# Patient Record
Sex: Female | Born: 1975
Health system: Southern US, Community
[De-identification: ages and names within clinical notes are randomized; demographics above are authoritative.]

## PROBLEM LIST (undated history)

## (undated) DIAGNOSIS — T7840XA Allergy, unspecified, initial encounter: Secondary | ICD-10-CM

## (undated) HISTORY — DX: Allergy, unspecified, initial encounter: T78.40XA

---

## 2013-01-22 LAB — HM PAP SMEAR

## 2015-10-01 DIAGNOSIS — Z1231 Encounter for screening mammogram for malignant neoplasm of breast: Secondary | ICD-10-CM | POA: Diagnosis not present

## 2015-10-23 LAB — HM MAMMOGRAPHY: HM Mammogram: NORMAL (ref 0–4)

## 2015-11-01 ENCOUNTER — Ambulatory Visit (INDEPENDENT_AMBULATORY_CARE_PROVIDER_SITE_OTHER): Payer: Self-pay | Admitting: Family Medicine

## 2015-11-01 ENCOUNTER — Encounter: Payer: Self-pay | Admitting: Family Medicine

## 2015-11-01 VITALS — BP 114/74 | Temp 98.2°F | Wt 200.0 lb

## 2015-11-01 DIAGNOSIS — J309 Allergic rhinitis, unspecified: Secondary | ICD-10-CM

## 2015-11-01 DIAGNOSIS — J0120 Acute ethmoidal sinusitis, unspecified: Secondary | ICD-10-CM

## 2015-11-01 MED ORDER — MONTELUKAST SODIUM 10 MG PO TABS: 10.0000 mg | ORAL_TABLET | Freq: Every day | ORAL | 3 refills | Status: DC

## 2015-11-01 MED ORDER — AMOXICILLIN 500 MG PO CAPS: 500.0000 mg | ORAL_CAPSULE | Freq: Three times a day (TID) | ORAL | 0 refills | Status: AC

## 2015-11-01 NOTE — Patient Instructions (Addendum)
Allergic Rhinitis Allergic rhinitis is when the mucous membranes in the nose respond to allergens. Allergens are particles in the air that cause your body to have an allergic reaction. This causes you to release allergic antibodies. Through a chain of events, these eventually cause you to release histamine into the blood stream. Although meant to protect the body, it is this release of histamine that causes your discomfort, such as frequent sneezing, congestion, and an itchy, runny nose.  CAUSES Seasonal allergic rhinitis (hay fever) is caused by pollen allergens that may come from grasses, trees, and weeds. Year-round allergic rhinitis (perennial allergic rhinitis) is caused by allergens such as house dust mites, pet dander, and mold spores. SYMPTOMS  Nasal stuffiness (congestion).  Itchy, runny nose with sneezing and tearing of the eyes. DIAGNOSIS Your health care provider can help you determine the allergen or allergens that trigger your symptoms. If you and your health care provider are unable to determine the allergen, skin or blood testing may be used. Your health care provider will diagnose your condition after taking your health history and performing a physical exam. Your health care provider may assess you for other related conditions, such as asthma, pink eye, or an ear infection. TREATMENT Allergic rhinitis does not have a cure, but it can be controlled by:  Medicines that block allergy symptoms. These may include allergy shots, nasal sprays, and oral antihistamines.  Avoiding the allergen. Hay fever may often be treated with antihistamines in pill or nasal spray forms. Antihistamines block the effects of histamine. There are over-the-counter medicines that may help with nasal congestion and swelling around the eyes. Check with your health care provider before taking or giving this medicine. If avoiding the allergen or the medicine prescribed do not work, there are many new medicines  your health care provider can prescribe. Stronger medicine may be used if initial measures are ineffective. Desensitizing injections can be used if medicine and avoidance does not work. Desensitization is when a patient is given ongoing shots until the body becomes less sensitive to the allergen. Make sure you follow up with your health care provider if problems continue. HOME CARE INSTRUCTIONS It is not possible to completely avoid allergens, but you can reduce your symptoms by taking steps to limit your exposure to them. It helps to know exactly what you are allergic to so that you can avoid your specific triggers. SEEK MEDICAL CARE IF:  You have a fever.  You develop a cough that does not stop easily (persistent).  You have shortness of breath.  You start wheezing.  Symptoms interfere with normal daily activities.   This information is not intended to replace advice given to you by your health care provider. Make sure you discuss any questions you have with your health care provider.   Document Released: 12/10/2000 Document Revised: 04/07/2014 Document Reviewed: 11/22/2012 Elsevier Interactive Patient Education 2016 Elsevier Inc.  Sinusitis, Adult Sinusitis is redness, soreness, and inflammation of the paranasal sinuses. Paranasal sinuses are air pockets within the bones of your face. They are located beneath your eyes, in the middle of your forehead, and above your eyes. In healthy paranasal sinuses, mucus is able to drain out, and air is able to circulate through them by way of your nose. However, when your paranasal sinuses are inflamed, mucus and air can become trapped. This can allow bacteria and other germs to grow and cause infection. Sinusitis can develop quickly and last only a short time (acute) or continue over a  long period (chronic). Sinusitis that lasts for more than 12 weeks is considered chronic. CAUSES Causes of sinusitis include:  Allergies.  Structural  abnormalities, such as displacement of the cartilage that separates your nostrils (deviated septum), which can decrease the air flow through your nose and sinuses and affect sinus drainage.  Functional abnormalities, such as when the small hairs (cilia) that line your sinuses and help remove mucus do not work properly or are not present. SIGNS AND SYMPTOMS Symptoms of acute and chronic sinusitis are the same. The primary symptoms are pain and pressure around the affected sinuses. Other symptoms include:  Upper toothache.  Earache.  Headache.  Bad breath.  Decreased sense of smell and taste.  A cough, which worsens when you are lying flat.  Fatigue.  Fever.  Thick drainage from your nose, which often is green and may contain pus (purulent).  Swelling and warmth over the affected sinuses. DIAGNOSIS Your health care provider will perform a physical exam. During your exam, your health care provider may perform any of the following to help determine if you have acute sinusitis or chronic sinusitis:  Look in your nose for signs of abnormal growths in your nostrils (nasal polyps).  Tap over the affected sinus to check for signs of infection.  View the inside of your sinuses using an imaging device that has a light attached (endoscope). If your health care provider suspects that you have chronic sinusitis, one or more of the following tests may be recommended:  Allergy tests.  Nasal culture. A sample of mucus is taken from your nose, sent to a lab, and screened for bacteria.  Nasal cytology. A sample of mucus is taken from your nose and examined by your health care provider to determine if your sinusitis is related to an allergy. TREATMENT Most cases of acute sinusitis are related to a viral infection and will resolve on their own within 10 days. Sometimes, medicines are prescribed to help relieve symptoms of both acute and chronic sinusitis. These may include pain medicines,  decongestants, nasal steroid sprays, or saline sprays. However, for sinusitis related to a bacterial infection, your health care provider will prescribe antibiotic medicines. These are medicines that will help kill the bacteria causing the infection. Rarely, sinusitis is caused by a fungal infection. In these cases, your health care provider will prescribe antifungal medicine. For some cases of chronic sinusitis, surgery is needed. Generally, these are cases in which sinusitis recurs more than 3 times per year, despite other treatments. HOME CARE INSTRUCTIONS  Drink plenty of water. Water helps thin the mucus so your sinuses can drain more easily.  Use a humidifier.  Inhale steam 3-4 times a day (for example, sit in the bathroom with the shower running).  Apply a warm, moist washcloth to your face 3-4 times a day, or as directed by your health care provider.  Use saline nasal sprays to help moisten and clean your sinuses.  Take medicines only as directed by your health care provider.  If you were prescribed either an antibiotic or antifungal medicine, finish it all even if you start to feel better.   SEEK IMMEDIATE MEDICAL CARE IF:  You have increasing pain or severe headaches.  You have nausea, vomiting, or drowsiness.  You have swelling around your face.  You have vision problems.  You have a stiff neck.  You have difficulty breathing.   This information is not intended to replace advice given to you by your health care provider. Make  sure you discuss any questions you have with your health care provider.   Document Released: 03/17/2005 Document Revised: 04/07/2014 Document Reviewed: 04/01/2011 Elsevier Interactive Patient Education 2016 Elsevier Inc. Cont taking maintenance allergy medication/Zyrtec.

## 2015-11-01 NOTE — Progress Notes (Signed)
   Subjective:    Patient ID: Tina Bautista, female    DOB: [birth year], 40 y.o.   MRN: [id number]  HPI    Review of Systems     Objective:   Physical Exam        Assessment & Plan:

## 2015-11-01 NOTE — Progress Notes (Signed)
   Subjective:    Patient ID: VERANIA ROYTMAN, female    DOB: 05/28/1975, 40 y.o.   MRN: [id number]  HPI  40 y.o. Presents with bil ear fullness x 2 days rt > lt. Pt states she has been leaning head to rt. Pt cleaning and moving into a new house. Possible exposure to cat dander from previous home owners.  Pt has not been taking maintenance environmental allergy meds. Pt took Benadryl last night.     Review of Systems Constitutional; Neg denies headache/fatigue Eyes watery/red/ Nose nasal stuffiness Ears fullness rt>lt CV Neg Resp denies cough Skin Neg Allergy sneezing/no runny nose/no angioedema    Objective:   Physical Exam  VSS Eyes erythematous/watery/no purulent drainage/no vision chges ENT postnasal drainage/throat erythematous/swellling and warmth over affected sinuses/maxillary sinus tender to touch/no nasal drainage/mild swelling in turbinates bil/ decrease sense of smell  Ears WNL internal/external/no hearing loss Neck bil cervical lymphadenopathy CV HR reg Resp BBS audible and clear  Skin inspect WNL         Assessment & Plan:  Pt ed given. Pt to take maintenance allergy meds Zyrtec/optional anithistamine eye drops/Flonase/ Anitibiotic given to be taken if symptoms do not improve in several days Avoid the allergens

## 2016-01-04 MED FILL — MONTELUKAST SOD 10 MG TAB: 10 | 30 days supply | Qty: 30 | Fill #0

## 2016-01-07 DIAGNOSIS — M9903 Segmental and somatic dysfunction of lumbar region: Secondary | ICD-10-CM | POA: Diagnosis not present

## 2016-01-10 DIAGNOSIS — M9903 Segmental and somatic dysfunction of lumbar region: Secondary | ICD-10-CM | POA: Diagnosis not present

## 2016-01-23 ENCOUNTER — Encounter: Payer: Self-pay | Admitting: Family Medicine

## 2016-01-23 ENCOUNTER — Ambulatory Visit (INDEPENDENT_AMBULATORY_CARE_PROVIDER_SITE_OTHER): Payer: 59 | Admitting: Family Medicine

## 2016-01-23 VITALS — BP 112/80 | HR 83 | Temp 98.0°F | Resp 16 | Ht 63.75 in | Wt 202.1 lb

## 2016-01-23 DIAGNOSIS — Z0000 Encounter for general adult medical examination without abnormal findings: Secondary | ICD-10-CM

## 2016-01-23 DIAGNOSIS — Z01419 Encounter for gynecological examination (general) (routine) without abnormal findings: Secondary | ICD-10-CM

## 2016-01-23 LAB — HEPATIC FUNCTION PANEL
ALBUMIN: 4.2 g/dL (ref 3.6–5.1)
ALT: 12 U/L (ref 6–29)
AST: 15 U/L (ref 10–30)
Alkaline Phosphatase: 46 U/L (ref 33–115)
BILIRUBIN TOTAL: 0.7 mg/dL (ref 0.2–1.2)
Bilirubin, Direct: 0.1 mg/dL (ref <=0.2)
Indirect Bilirubin: 0.6 mg/dL (ref 0.2–1.2)
Total Protein: 6.7 g/dL (ref 6.1–8.1)

## 2016-01-23 LAB — CBC WITH DIFFERENTIAL/PLATELET
Basophils Absolute: 0 cells/uL (ref 0–200)
Basophils Relative: 0 %
EOS PCT: 4 %
Eosinophils Absolute: 272 cells/uL (ref 15–500)
HCT: 41 % (ref 35.0–45.0)
HEMOGLOBIN: 13.8 g/dL (ref 11.7–15.5)
LYMPHS ABS: 2176 {cells}/uL (ref 850–3900)
Lymphocytes Relative: 32 %
MCH: 29.2 pg (ref 27.0–33.0)
MCHC: 33.7 g/dL (ref 32.0–36.0)
MCV: 86.9 fL (ref 80.0–100.0)
MPV: 10.5 fL (ref 7.5–12.5)
Monocytes Absolute: 408 cells/uL (ref 200–950)
Monocytes Relative: 6 %
NEUTROS ABS: 3944 {cells}/uL (ref 1500–7800)
Neutrophils Relative %: 58 %
Platelets: 237 10*3/uL (ref 140–400)
RBC: 4.72 MIL/uL (ref 3.80–5.10)
RDW: 13.6 % (ref 11.0–15.0)
WBC: 6.8 10*3/uL (ref 3.8–10.8)

## 2016-01-23 LAB — BASIC METABOLIC PANEL
BUN: 9 mg/dL (ref 7–25)
CO2: 26 mmol/L (ref 20–31)
CREATININE: 0.83 mg/dL (ref 0.50–1.10)
Calcium: 9.3 mg/dL (ref 8.6–10.2)
Chloride: 106 mmol/L (ref 98–110)
Glucose, Bld: 87 mg/dL (ref 65–99)
POTASSIUM: 4 mmol/L (ref 3.5–5.3)
Sodium: 140 mmol/L (ref 135–146)

## 2016-01-23 LAB — LIPID PANEL
CHOL/HDL RATIO: 4.2 ratio (ref <=5.0)
CHOLESTEROL: 217 mg/dL — AB (ref 125–200)
HDL: 52 mg/dL (ref >=46)
LDL Cholesterol: 136 mg/dL — ABNORMAL HIGH (ref <130)
TRIGLYCERIDES: 145 mg/dL (ref <150)
VLDL: 29 mg/dL (ref <30)

## 2016-01-23 LAB — TSH: TSH: 1.88 m[IU]/L

## 2016-01-23 NOTE — Patient Instructions (Signed)
Follow up in 1 year or as needed We'll notify you of your lab results and make any changes if needed We'll call you with your GYN appt Continue to work on healthy diet and regular exercise- you can do it!! Call with any questions or concerns Welcome!  We're glad to have you!!!

## 2016-01-23 NOTE — Progress Notes (Signed)
Pre visit review using our clinic review tool, if applicable. No additional management support is needed unless otherwise documented below in the visit note. 

## 2016-01-23 NOTE — Progress Notes (Signed)
   Subjective:    Patient ID: Tina Bautista, female    DOB: Sep 01, 1975, 40 y.o.   MRN: [id number]  HPI New to establish.  Recently moved to area.  Needs GYN referral.  CPE- no concerns, UTD on mammo   Review of Systems Patient reports no vision/ hearing changes, adenopathy,fever, weight change,  persistant/recurrent hoarseness , swallowing issues, chest pain, palpitations, edema, persistant/recurrent cough, hemoptysis, dyspnea (rest/exertional/paroxysmal nocturnal), gastrointestinal bleeding (melena, rectal bleeding), abdominal pain, significant heartburn, bowel changes, GU symptoms (dysuria, hematuria, incontinence), Gyn symptoms (abnormal  bleeding, pain),  syncope, focal weakness, memory loss, numbness & tingling, skin/hair/nail changes, abnormal bruising or bleeding, anxiety, or depression.     Objective:   Physical Exam General Appearance:    Alert, cooperative, no distress, appears stated age  Head:    Normocephalic, without obvious abnormality, atraumatic  Eyes:    PERRL, conjunctiva/corneas clear, EOM's intact, fundi    benign, both eyes  Ears:    Normal TM's and external ear canals, both ears  Nose:   Nares normal, septum midline, mucosa normal, no drainage    or sinus tenderness  Throat:   Lips, mucosa, and tongue normal; teeth and gums normal  Neck:   Supple, symmetrical, trachea midline, no adenopathy;    Thyroid: no enlargement/tenderness/nodules  Back:     Symmetric, no curvature, ROM normal, no CVA tenderness  Lungs:     Clear to auscultation bilaterally, respirations unlabored  Chest Wall:    No tenderness or deformity   Heart:    Regular rate and rhythm, S1 and S2 normal, no murmur, rub   or gallop  Breast Exam:    Deferred to GYN  Abdomen:     Soft, non-tender, bowel sounds active all four quadrants,    no masses, no organomegaly  Genitalia:    Deferred to GYN  Rectal:    Extremities:   Extremities normal, atraumatic, no cyanosis or edema  Pulses:   2+ and  symmetric all extremities  Skin:   Skin color, texture, turgor normal, no rashes or lesions  Lymph nodes:   Cervical, supraclavicular, and axillary nodes normal  Neurologic:   CNII-XII intact, normal strength, sensation and reflexes    throughout          Assessment & Plan:  PE- pt's PE WNL w/ exception of being overweight.  Needs referral to GYN.  Check labs.  Anticipatory guidance provided.

## 2016-01-24 ENCOUNTER — Other Ambulatory Visit: Payer: Self-pay | Admitting: Family Medicine

## 2016-01-24 LAB — VITAMIN D 25 HYDROXY (VIT D DEFICIENCY, FRACTURES): VIT D 25 HYDROXY: 21 ng/mL — AB (ref 30–100)

## 2016-01-24 MED ORDER — VITAMIN D 50 MCG (2000 UT) PO TABS: 2000.0000 [IU] | ORAL_TABLET | Freq: Every day | ORAL | 0 refills | Status: DC

## 2016-01-24 MED ORDER — VITAMIN D (ERGOCALCIFEROL) 1.25 MG (50000 UNIT) PO CAPS: ORAL_CAPSULE | ORAL | 0 refills | Status: DC

## 2016-01-31 DIAGNOSIS — M9903 Segmental and somatic dysfunction of lumbar region: Secondary | ICD-10-CM | POA: Diagnosis not present

## 2016-02-18 DIAGNOSIS — M9903 Segmental and somatic dysfunction of lumbar region: Secondary | ICD-10-CM | POA: Diagnosis not present

## 2016-03-03 ENCOUNTER — Ambulatory Visit: Payer: 59 | Admitting: Family Medicine

## 2016-03-04 DIAGNOSIS — M9903 Segmental and somatic dysfunction of lumbar region: Secondary | ICD-10-CM | POA: Diagnosis not present

## 2016-05-02 MED FILL — MONTELUKAST SOD 10 MG TAB: 10 | 30 days supply | Qty: 30 | Fill #1

## 2016-06-16 DIAGNOSIS — M9903 Segmental and somatic dysfunction of lumbar region: Secondary | ICD-10-CM | POA: Diagnosis not present

## 2016-06-18 DIAGNOSIS — M9903 Segmental and somatic dysfunction of lumbar region: Secondary | ICD-10-CM | POA: Diagnosis not present

## 2016-07-09 ENCOUNTER — Telehealth: Payer: Self-pay | Admitting: *Deleted

## 2016-07-09 MED ORDER — MONTELUKAST SODIUM 10 MG PO TABS: 10.0000 mg | ORAL_TABLET | Freq: Every day | ORAL | 6 refills | Status: DC

## 2016-07-09 MED FILL — MONTELUKAST SOD 10 MG TAB: 10 | 30 days supply | Qty: 30 | Fill #0

## 2016-07-09 NOTE — Telephone Encounter (Signed)
Patient was seen last august at urgent care and given singulair rx - she has run out of refills and is wondering if this is something you will send in for her.

## 2016-07-09 NOTE — Telephone Encounter (Signed)
Medication filled to pharmacy as requested.   

## 2016-07-09 NOTE — Telephone Encounter (Signed)
Ok to refill Singulair , #[phone], 6 refills

## 2016-07-09 NOTE — Addendum Note (Signed)
Addended by: Geannie Risen on: 07/09/2016 12:06 PM   Modules accepted: Orders

## 2016-08-20 ENCOUNTER — Encounter: Payer: Self-pay | Admitting: Obstetrics & Gynecology

## 2016-08-20 ENCOUNTER — Ambulatory Visit (INDEPENDENT_AMBULATORY_CARE_PROVIDER_SITE_OTHER): Payer: 59 | Admitting: Obstetrics & Gynecology

## 2016-08-20 VITALS — BP 142/70 | HR 96 | Ht 63.0 in | Wt 209.0 lb

## 2016-08-20 DIAGNOSIS — Z01419 Encounter for gynecological examination (general) (routine) without abnormal findings: Secondary | ICD-10-CM

## 2016-08-20 DIAGNOSIS — Z124 Encounter for screening for malignant neoplasm of cervix: Secondary | ICD-10-CM | POA: Diagnosis not present

## 2016-08-20 DIAGNOSIS — Z1239 Encounter for other screening for malignant neoplasm of breast: Secondary | ICD-10-CM

## 2016-08-20 DIAGNOSIS — Z1151 Encounter for screening for human papillomavirus (HPV): Secondary | ICD-10-CM

## 2016-08-20 NOTE — Patient Instructions (Signed)
Laparoscopic Tubal Ligation Laparoscopic tubal ligation is a procedure to close the fallopian tubes. This is done so that you cannot get pregnant. When the fallopian tubes are closed, the eggs that your ovaries release cannot enter the uterus, and sperm cannot reach the released eggs. A laparoscopic tubal ligation is sometimes called "getting your tubes tied." You should not have this procedure if you want to get pregnant someday or if you are unsure about having more children. Tell a health care provider about:  Any allergies you have.  All medicines you are taking, including vitamins, herbs, eye drops, creams, and over-the-counter medicines.  Any problems you or family members have had with anesthetic medicines.  Any blood disorders you have.  Any surgeries you have had.  Any medical conditions you have.  Whether you are pregnant or may be pregnant.  Any past pregnancies. What are the risks? Generally, this is a safe procedure. However, problems may occur, including:  Infection.  Bleeding.  Injury to surrounding organs.  Side effects from anesthetics.  Failure of the procedure. This procedure can increase your risk of a kind of pregnancy in which a fertilized egg attaches to the outside of the uterus (ectopic pregnancy). What happens before the procedure?  Ask your health care provider about:  Changing or stopping your regular medicines. This is especially important if you are taking diabetes medicines or blood thinners.  Taking medicines such as aspirin and ibuprofen. These medicines can thin your blood. Do not take these medicines before your procedure if your health care provider instructs you not to.  Follow instructions from your health care provider about eating and drinking restrictions.  Plan to have someone take you home after the procedure.  If you go home right after the procedure, plan to have someone with you for 24 hours. What happens during the  procedure?  You will be given one or more of the following:  A medicine to help you relax (sedative).  A medicine to numb the area (local anesthetic).  A medicine to make you fall asleep (general anesthetic).  A medicine that is injected into an area of your body to numb everything below the injection site (regional anesthetic).  An IV tube will be inserted into one of your veins. It will be used to give you medicines and fluids during the procedure.  Your bladder may be emptied with a small tube (catheter).  If you have been given a general anesthetic, a tube will be put down your throat to help you breathe.  Two small cuts (incisions) will be made in your lower abdomen and near your belly button.  Your abdomen will be inflated with a gas. This will let the surgeon see better and will give the surgeon room to work.  A thin, lighted tube (laparoscope) with a camera attached will be inserted into your abdomen through one of the incisions. Small instruments will be inserted through the other incision.  The fallopian tubes will be tied off, burned (cauterized), or blocked with a clip, ring, or clamp. A small portion in the center of each fallopian tube may be removed.  The gas will be released from the abdomen.  The incisions will be closed with stitches (sutures).  A bandage (dressing) will be placed over the incisions. The procedure may vary among health care providers and hospitals. What happens after the procedure?  Your blood pressure, heart rate, breathing rate, and blood oxygen level will be monitored often until the medicines you were   given have worn off.  You will be given medicine to help with pain, nausea, and vomiting as needed. This information is not intended to replace advice given to you by your health care provider. Make sure you discuss any questions you have with your health care provider. Document Released: 06/23/2000 Document Revised: 08/23/2015 Document  Reviewed: 02/25/2015 Elsevier Interactive Patient Education  2017 [employer].  Levonorgestrel intrauterine device (IUD) What is this medicine? LEVONORGESTREL IUD (LEE voe nor jes trel) is a contraceptive (birth control) device. The device is placed inside the uterus by a healthcare professional. It is used to prevent pregnancy. This device can also be used to treat heavy bleeding that occurs during your period. This medicine may be used for other purposes; ask your health care provider or pharmacist if you have questions. COMMON BRAND NAME(S): Cameron Ali What should I tell my health care provider before I take this medicine? They need to know if you have any of these conditions: -abnormal Pap smear -cancer of the breast, uterus, or cervix -diabetes -endometritis -genital or pelvic infection now or in the past -have more than one sexual partner or your partner has more than one partner -heart disease -history of an ectopic or tubal pregnancy -immune system problems -IUD in place -liver disease or tumor -problems with blood clots or take blood-thinners -seizures -use intravenous drugs -uterus of unusual shape -vaginal bleeding that has not been explained -an unusual or allergic reaction to levonorgestrel, other hormones, silicone, or polyethylene, medicines, foods, dyes, or preservatives -pregnant or trying to get pregnant -breast-feeding How should I use this medicine? This device is placed inside the uterus by a health care professional. Talk to your pediatrician regarding the use of this medicine in children. Special care may be needed. Overdosage: If you think you have taken too much of this medicine contact a poison control center or emergency room at once. NOTE: This medicine is only for you. Do not share this medicine with others. What if I miss a dose? This does not apply. Depending on the brand of device you have inserted, the device will need to be  replaced every 3 to 5 years if you wish to continue using this type of birth control. What may interact with this medicine? Do not take this medicine with any of the following medications: -amprenavir -bosentan -fosamprenavir This medicine may also interact with the following medications: -aprepitant -armodafinil -barbiturate medicines for inducing sleep or treating seizures -bexarotene -boceprevir -griseofulvin -medicines to treat seizures like carbamazepine, ethotoin, felbamate, oxcarbazepine, phenytoin, topiramate -modafinil -pioglitazone -rifabutin -rifampin -rifapentine -some medicines to treat HIV infection like atazanavir, efavirenz, indinavir, lopinavir, nelfinavir, tipranavir, ritonavir -St. John's wort -warfarin This list may not describe all possible interactions. Give your health care provider a list of all the medicines, herbs, non-prescription drugs, or dietary supplements you use. Also tell them if you smoke, drink alcohol, or use illegal drugs. Some items may interact with your medicine. What should I watch for while using this medicine? Visit your doctor or health care professional for regular check ups. See your doctor if you or your partner has sexual contact with others, becomes HIV positive, or gets a sexual transmitted disease. This product does not protect you against HIV infection (AIDS) or other sexually transmitted diseases. You can check the placement of the IUD yourself by reaching up to the top of your vagina with clean fingers to feel the threads. Do not pull on the threads. It is a good habit to  check placement after each menstrual period. Call your doctor right away if you feel more of the IUD than just the threads or if you cannot feel the threads at all. The IUD may come out by itself. You may become pregnant if the device comes out. If you notice that the IUD has come out use a backup birth control method like condoms and call your health care  provider. Using tampons will not change the position of the IUD and are okay to use during your period. This IUD can be safely scanned with magnetic resonance imaging (MRI) only under specific conditions. Before you have an MRI, tell your healthcare provider that you have an IUD in place, and which type of IUD you have in place. What side effects may I notice from receiving this medicine? Side effects that you should report to your doctor or health care professional as soon as possible: -allergic reactions like skin rash, itching or hives, swelling of the face, lips, or tongue -fever, flu-like symptoms -genital sores -high blood pressure -no menstrual period for 6 weeks during use -pain, swelling, warmth in the leg -pelvic pain or tenderness -severe or sudden headache -signs of pregnancy -stomach cramping -sudden shortness of breath -trouble with balance, talking, or walking -unusual vaginal bleeding, discharge -yellowing of the eyes or skin Side effects that usually do not require medical attention (report to your doctor or health care professional if they continue or are bothersome): -acne -breast pain -change in sex drive or performance -changes in weight -cramping, dizziness, or faintness while the device is being inserted -headache -irregular menstrual bleeding within first 3 to 6 months of use -nausea This list may not describe all possible side effects. Call your doctor for medical advice about side effects. You may report side effects to FDA at 1-800-FDA-1088. Where should I keep my medicine? This does not apply. NOTE: This sheet is a summary. It may not cover all possible information. If you have questions about this medicine, talk to your doctor, pharmacist, or health care provider.  2018 Elsevier/Gold Standard (2015-12-28 14:14:56)

## 2016-08-20 NOTE — Progress Notes (Signed)
Subjective:     Tina Bautista is a 41 y.o. female here for a routine exam. G3P3. c-section x 3. Current complaints: none  Pt reports monthly cycles.  Last PAP was 1 year. Pt reports that she is UTD on her mammogram.     Gynecologic History No LMP recorded. Contraception: abstinence and condoms Last Pap: 2017. Results were: normal Last mammogram: 2017. Results were: normal  Obstetric History OB History  Gravida Para Term Preterm AB Living  3 3 3         SAB TAB Ectopic Multiple Live Births          3    # Outcome Date GA Lbr Len/2nd Weight Sex Delivery Anes PTL Lv  3 Term      CS-Unspec     2 Term      CS-Unspec     1 Term      CS-Unspec        The following portions of the patient's history were reviewed and updated as appropriate: allergies, current medications, past family history, past medical history, past social history, past surgical history and problem list.  Review of Systems Pertinent items are noted in HPI.    Objective:  BP (!) 142/70   Pulse 96   Ht 5\' 3"  (1.6 m)   Wt 209 lb (94.8 kg)   BMI 37.02 kg/m   General Appearance:    Alert, cooperative, no distress, appears stated age  Head:    Normocephalic, without obvious abnormality, atraumatic  Eyes:    conjunctiva/corneas clear, EOM's intact, both eyes  Ears:    Normal external ear canals, both ears  Nose:   Nares normal, septum midline, mucosa normal, no drainage    or sinus tenderness  Throat:   Lips, mucosa, and tongue normal; teeth and gums normal  Neck:   Supple, symmetrical, trachea midline, no adenopathy;    thyroid:  no enlargement/tenderness/nodules  Back:     Symmetric, no curvature, ROM normal, no CVA tenderness  Lungs:     Clear to auscultation bilaterally, respirations unlabored  Chest Wall:    No tenderness or deformity   Heart:    Regular rate and rhythm, S1 and S2 normal, no murmur, rub   or gallop  Breast Exam:    No tenderness, masses, or nipple abnormality  Abdomen:     Soft,  non-tender, bowel sounds active all four quadrants,    no masses, no organomegaly  Genitalia:    Normal female without lesion, discharge or tenderness     Extremities:   Extremities normal, atraumatic, no cyanosis or edema  Pulses:   2+ and symmetric all extremities  Skin:   Skin color, texture, turgor normal, no rashes or lesions     Assessment:    Healthy female exam.   Contraception counseling  Breast cancer screening   Plan:    Education reviewed: contraception. Mammogram ordered. Follow up in: 1 year.  PAP in 3 years if WNL  [name] L. Harraway-Smith, M.D., Evern CoreFACOG

## 2016-08-20 NOTE — Addendum Note (Signed)
Addended by: Anell BarrHOWARD, Terriona L on: 08/20/2016 02:48 PM   Modules accepted: Orders

## 2016-08-26 LAB — CYTOLOGY - PAP
Diagnosis: NEGATIVE
HPV: NOT DETECTED

## 2016-08-28 ENCOUNTER — Inpatient Hospital Stay (HOSPITAL_BASED_OUTPATIENT_CLINIC_OR_DEPARTMENT_OTHER): Admission: RE | Admit: 2016-08-28 | Payer: 59 | Source: Ambulatory Visit

## 2016-10-07 MED FILL — MONTELUKAST SOD 10 MG TAB: 10 | 30 days supply | Qty: 30 | Fill #1

## 2016-10-23 ENCOUNTER — Ambulatory Visit (HOSPITAL_BASED_OUTPATIENT_CLINIC_OR_DEPARTMENT_OTHER)
Admission: RE | Admit: 2016-10-23 | Discharge: 2016-10-23 | Disposition: A | Discharge status: Home / Self Care | Payer: 59 | Source: Ambulatory Visit | Attending: Obstetrics & Gynecology | Admitting: Obstetrics & Gynecology

## 2016-10-23 ENCOUNTER — Encounter (HOSPITAL_BASED_OUTPATIENT_CLINIC_OR_DEPARTMENT_OTHER): Payer: Self-pay | Admitting: Radiology

## 2016-10-23 DIAGNOSIS — Z1231 Encounter for screening mammogram for malignant neoplasm of breast: Secondary | ICD-10-CM | POA: Insufficient documentation

## 2016-10-23 DIAGNOSIS — Z1239 Encounter for other screening for malignant neoplasm of breast: Secondary | ICD-10-CM

## 2017-02-06 ENCOUNTER — Encounter: Payer: Self-pay | Admitting: Family Medicine

## 2017-02-06 ENCOUNTER — Ambulatory Visit (INDEPENDENT_AMBULATORY_CARE_PROVIDER_SITE_OTHER): Payer: 59 | Admitting: Family Medicine

## 2017-02-06 ENCOUNTER — Other Ambulatory Visit: Payer: Self-pay

## 2017-02-06 DIAGNOSIS — F339 Major depressive disorder, recurrent, unspecified: Secondary | ICD-10-CM | POA: Diagnosis not present

## 2017-02-06 MED ORDER — CITALOPRAM HYDROBROMIDE 20 MG PO TABS: 20.0000 mg | ORAL_TABLET | Freq: Every day | ORAL | 3 refills | Status: DC

## 2017-02-06 MED FILL — CITALOPRAM HBR 20 MG TABLET: 20 | 30 days supply | Qty: 30 | Fill #0

## 2017-02-06 NOTE — Assessment & Plan Note (Signed)
New to provider.  Pt had issues after each pregnancy but now that she is not having any more children she is interested in treating.  Start low dose Celexa 20mg .  Encouraged stress outlet.  Will follow closely.

## 2017-02-06 NOTE — Progress Notes (Signed)
   Subjective:    Patient ID: Tina Bautista, female    DOB: Jan 02, 1976, 41 y.o.   MRN: [id number]  HPI Depression- pt reports that w/ each child she has had 'some kind of post-partum'.  Youngest child is now 2 'but I still feel like crap'.  'I feel really overwhelmed, I don't want to do anything'.  It took 4 yrs after the birth of her middle child for her to 'feel normal'.  Pt has also had a big move to the area recently.     Review of Systems For ROS see HPI     Objective:   Physical Exam  Constitutional: She is oriented to person, place, and time. She appears well-developed and well-nourished. No distress.  HENT:  Head: Normocephalic and atraumatic.  Neurological: She is alert and oriented to person, place, and time.  Skin: Skin is warm and dry.  Psychiatric: She has a normal mood and affect. Her behavior is normal. Thought content normal.  Vitals reviewed.         Assessment & Plan:

## 2017-02-06 NOTE — Patient Instructions (Signed)
Follow up in 3-4 weeks to recheck mood Start the Citalopram once daily- take w/ food Call with any questions or concerns Happy Thanksgiving! Hang in there!  You've got this!

## 2017-03-02 MED FILL — CITALOPRAM HBR 20 MG TABLET: 20 | 30 days supply | Qty: 30 | Fill #1

## 2017-03-02 MED FILL — MONTELUKAST SOD 10 MG TAB: 10 | 30 days supply | Qty: 30 | Fill #2

## 2017-03-11 ENCOUNTER — Ambulatory Visit: Payer: 59 | Admitting: Family Medicine

## 2017-04-09 MED FILL — CITALOPRAM HBR 20 MG TABLET: 20 | 30 days supply | Qty: 30 | Fill #2

## 2017-04-10 ENCOUNTER — Ambulatory Visit (INDEPENDENT_AMBULATORY_CARE_PROVIDER_SITE_OTHER): Payer: 59 | Admitting: Family Medicine

## 2017-04-10 ENCOUNTER — Encounter: Payer: Self-pay | Admitting: Family Medicine

## 2017-04-10 ENCOUNTER — Other Ambulatory Visit: Payer: Self-pay

## 2017-04-10 VITALS — BP 126/81 | HR 74 | Temp 98.1°F | Resp 16 | Ht 63.0 in | Wt 211.5 lb

## 2017-04-10 DIAGNOSIS — F339 Major depressive disorder, recurrent, unspecified: Secondary | ICD-10-CM

## 2017-04-10 NOTE — Patient Instructions (Signed)
Schedule your complete physical in 6 months Continue the Celexa- you look great! Call with any questions or concerns Happy New Year!!!

## 2017-04-10 NOTE — Progress Notes (Signed)
   Subjective:    Patient ID: Tina Bautista, female    DOB: Dec 28, 1975, 42 y.o.   MRN: [id number]  HPI Depression- pt feels things are 'really really good' since starting the Celexa.  Sleep is good.  Pt reports that she is now able to feel full again w/ eating, 'i'm eating normal portions'.  Less irritable.  Less tearful.  More patience with the kids.  'it's been years since I've done this.  My whole family is very very happy'.   Review of Systems For ROS see HPI     Objective:   Physical Exam  Constitutional: She is oriented to person, place, and time. She appears well-developed and well-nourished. No distress.  HENT:  Head: Normocephalic and atraumatic.  Neurological: She is alert and oriented to person, place, and time.  Skin: Skin is warm and dry.  Psychiatric: She has a normal mood and affect. Her behavior is normal. Thought content normal.  Vitals reviewed.         Assessment & Plan:

## 2017-04-10 NOTE — Assessment & Plan Note (Signed)
Much improved since starting Celexa.  Pt is amazed at how much better she feels on the medication.  Very pleased w/ the results.  No need to increase dose at this time.  Will continue to monitor.

## 2017-05-08 MED FILL — CITALOPRAM HBR 20 MG TABLET: 20 | 30 days supply | Qty: 30 | Fill #3

## 2017-05-29 ENCOUNTER — Other Ambulatory Visit: Payer: Self-pay | Admitting: Family Medicine

## 2017-06-16 MED FILL — CITALOPRAM HBR 20 MG TABLET: 20 | 30 days supply | Qty: 30 | Fill #0 | Status: TO

## 2017-10-03 ENCOUNTER — Other Ambulatory Visit: Payer: Self-pay | Admitting: Family Medicine

## 2017-10-14 ENCOUNTER — Other Ambulatory Visit: Payer: Self-pay | Admitting: Obstetrics & Gynecology

## 2017-10-14 DIAGNOSIS — Z1231 Encounter for screening mammogram for malignant neoplasm of breast: Secondary | ICD-10-CM

## 2017-10-15 ENCOUNTER — Other Ambulatory Visit: Payer: Self-pay

## 2017-10-15 ENCOUNTER — Encounter: Payer: Self-pay | Admitting: Family Medicine

## 2017-10-15 ENCOUNTER — Ambulatory Visit (INDEPENDENT_AMBULATORY_CARE_PROVIDER_SITE_OTHER): Payer: 59 | Admitting: Family Medicine

## 2017-10-15 VITALS — BP 120/80 | HR 83 | Temp 98.1°F | Resp 16 | Ht 63.0 in | Wt 218.5 lb

## 2017-10-15 DIAGNOSIS — E669 Obesity, unspecified: Secondary | ICD-10-CM | POA: Insufficient documentation

## 2017-10-15 DIAGNOSIS — E559 Vitamin D deficiency, unspecified: Secondary | ICD-10-CM | POA: Diagnosis not present

## 2017-10-15 DIAGNOSIS — Z0000 Encounter for general adult medical examination without abnormal findings: Secondary | ICD-10-CM

## 2017-10-15 DIAGNOSIS — F339 Major depressive disorder, recurrent, unspecified: Secondary | ICD-10-CM

## 2017-10-15 LAB — LIPID PANEL
Cholesterol: 203 mg/dL — ABNORMAL HIGH (ref 0–200)
HDL: 52.8 mg/dL (ref >39.00)
LDL Cholesterol: 126 mg/dL — ABNORMAL HIGH (ref 0–99)
NONHDL: 150.39
Total CHOL/HDL Ratio: 4
Triglycerides: 121 mg/dL (ref 0.0–149.0)
VLDL: 24.2 mg/dL (ref 0.0–40.0)

## 2017-10-15 LAB — BASIC METABOLIC PANEL
BUN: 10 mg/dL (ref 6–23)
CHLORIDE: 106 meq/L (ref 96–112)
CO2: 29 meq/L (ref 19–32)
CREATININE: 0.73 mg/dL (ref 0.40–1.20)
Calcium: 9.5 mg/dL (ref 8.4–10.5)
GFR: 92.44 mL/min (ref >60.00)
Glucose, Bld: 86 mg/dL (ref 70–99)
Potassium: 4 mEq/L (ref 3.5–5.1)
Sodium: 141 mEq/L (ref 135–145)

## 2017-10-15 LAB — CBC WITH DIFFERENTIAL/PLATELET
BASOS PCT: 0.8 % (ref 0.0–3.0)
Basophils Absolute: 0.1 10*3/uL (ref 0.0–0.1)
EOS ABS: 0.5 10*3/uL (ref 0.0–0.7)
Eosinophils Relative: 6.9 % — ABNORMAL HIGH (ref 0.0–5.0)
HCT: 39.8 % (ref 36.0–46.0)
Hemoglobin: 13.6 g/dL (ref 12.0–15.0)
Lymphocytes Relative: 30 % (ref 12.0–46.0)
Lymphs Abs: 2 10*3/uL (ref 0.7–4.0)
MCHC: 34.1 g/dL (ref 30.0–36.0)
MCV: 86.6 fl (ref 78.0–100.0)
MONO ABS: 0.5 10*3/uL (ref 0.1–1.0)
Monocytes Relative: 7.5 % (ref 3.0–12.0)
NEUTROS ABS: 3.6 10*3/uL (ref 1.4–7.7)
Neutrophils Relative %: 54.8 % (ref 43.0–77.0)
PLATELETS: 215 10*3/uL (ref 150.0–400.0)
RBC: 4.6 Mil/uL (ref 3.87–5.11)
RDW: 13.2 % (ref 11.5–15.5)
WBC: 6.5 10*3/uL (ref 4.0–10.5)

## 2017-10-15 LAB — VITAMIN D 25 HYDROXY (VIT D DEFICIENCY, FRACTURES): VITD: 22.62 ng/mL — AB (ref 30.00–100.00)

## 2017-10-15 LAB — HEPATIC FUNCTION PANEL
ALT: 14 U/L (ref 0–35)
AST: 13 U/L (ref 0–37)
Albumin: 4.3 g/dL (ref 3.5–5.2)
Alkaline Phosphatase: 45 U/L (ref 39–117)
BILIRUBIN DIRECT: 0.2 mg/dL (ref 0.0–0.3)
BILIRUBIN TOTAL: 1 mg/dL (ref 0.2–1.2)
Total Protein: 6.8 g/dL (ref 6.0–8.3)

## 2017-10-15 LAB — TSH: TSH: 2.43 u[IU]/mL (ref 0.35–4.50)

## 2017-10-15 MED ORDER — VENLAFAXINE HCL ER 37.5 MG PO CP24: 37.5000 mg | ORAL_CAPSULE | Freq: Every day | ORAL | 3 refills | Status: DC

## 2017-10-15 MED FILL — VENLAFAXINE HCL ER 37.5 MG: 37.5 | 30 days supply | Qty: 30 | Fill #0

## 2017-10-15 NOTE — Assessment & Plan Note (Signed)
Check labs and replete prn. 

## 2017-10-15 NOTE — Patient Instructions (Addendum)
Follow up in 1 year or as needed Contact me by phone or MyChart and let me know how the new medication is working and if we need to increase the dose We'll notify you of your lab results and make any changes if needed Continue to work on healthy diet and regular exercise- you can do it!!! Call with any questions or concerns Have a great trip!!!

## 2017-10-15 NOTE — Progress Notes (Signed)
   Subjective:    Patient ID: Craig StaggersJennifer M Wadleigh, female    DOB: 01-Dec-1975, 42 y.o.   MRN: [id number]  HPI CPE- UTD on pap, mammo.  UTD on Tdap.  Pt has gained 6 lbs since last visit.   Review of Systems Patient reports no vision/ hearing changes, adenopathy,fever, weight change,  persistant/recurrent hoarseness , swallowing issues, chest pain, palpitations, edema, persistant/recurrent cough, hemoptysis, dyspnea (rest/exertional/paroxysmal nocturnal), gastrointestinal bleeding (melena, rectal bleeding), abdominal pain, significant heartburn, bowel changes, GU symptoms (dysuria, hematuria, incontinence), Gyn symptoms (abnormal  bleeding, pain),  syncope, focal weakness, memory loss, numbness & tingling, skin/hair/nail changes, abnormal bruising or bleeding  Depression- ongoing.  Pt stopped Celexa b/c it made her very hot.  She felt it did 'wonders' for her mood and has noticed some back slide since stopping this.  Is interested in another medication.     Objective:   Physical Exam General Appearance:    Alert, cooperative, no distress, appears stated age, obese  Head:    Normocephalic, without obvious abnormality, atraumatic  Eyes:    PERRL, conjunctiva/corneas clear, EOM's intact, fundi    benign, both eyes  Ears:    Normal TM's and external ear canals, both ears  Nose:   Nares normal, septum midline, mucosa normal, no drainage    or sinus tenderness  Throat:   Lips, mucosa, and tongue normal; teeth and gums normal  Neck:   Supple, symmetrical, trachea midline, no adenopathy;    Thyroid: no enlargement/tenderness/nodules  Back:     Symmetric, no curvature, ROM normal, no CVA tenderness  Lungs:     Clear to auscultation bilaterally, respirations unlabored  Chest Wall:    No tenderness or deformity   Heart:    Regular rate and rhythm, S1 and S2 normal, no murmur, rub   or gallop  Breast Exam:    Deferred to GYN  Abdomen:     Soft, non-tender, bowel sounds active all four quadrants,    no  masses, no organomegaly  Genitalia:    Deferred to GYN  Rectal:    Extremities:   Extremities normal, atraumatic, no cyanosis or edema  Pulses:   2+ and symmetric all extremities  Skin:   Skin color, texture, turgor normal, no rashes or lesions  Lymph nodes:   Cervical, supraclavicular, and axillary nodes normal  Neurologic:   CNII-XII intact, normal strength, sensation and reflexes    throughout          Assessment & Plan:

## 2017-10-15 NOTE — Assessment & Plan Note (Signed)
Pt's PE WNL w/ exception of obesity.  UTD on GYN, Tdap.  Check labs.  Anticipatory guidance provided.  

## 2017-10-15 NOTE — Assessment & Plan Note (Signed)
Pt was doing very well on Celexa but noted that the medication made her very hot.  She has been working as a Environmental managerphotographer for [employer] and all the time spent outside has been unbearably hot.  B/c of this, she stopped her Celexa.  She has noticed some back slide in mood.  Based on this, will start low dose Effexor to tx mood and hot flashes and monitor for improvement.  Pt expressed understanding and is in agreement w/ plan.

## 2017-10-15 NOTE — Assessment & Plan Note (Signed)
Pt has gained 6 lbs since last visit.  Stressed need for healthy diet and regular exercise.  Check labs to risk stratify.  Will follow. 

## 2017-10-16 ENCOUNTER — Other Ambulatory Visit: Payer: Self-pay | Admitting: General Practice

## 2017-10-16 MED ORDER — VITAMIN D (ERGOCALCIFEROL) 1.25 MG (50000 UNIT) PO CAPS: 50000.0000 [IU] | ORAL_CAPSULE | ORAL | 0 refills | Status: DC

## 2017-10-16 MED FILL — VIT D2 1.25 MG (50,000 UNIT: 1.25 MG | 84 days supply | Qty: 12 | Fill #0

## 2017-11-11 ENCOUNTER — Ambulatory Visit (INDEPENDENT_AMBULATORY_CARE_PROVIDER_SITE_OTHER): Payer: 59 | Admitting: Obstetrics & Gynecology

## 2017-11-11 ENCOUNTER — Ambulatory Visit (HOSPITAL_BASED_OUTPATIENT_CLINIC_OR_DEPARTMENT_OTHER): Payer: 59

## 2017-11-11 ENCOUNTER — Encounter: Payer: Self-pay | Admitting: Obstetrics & Gynecology

## 2017-11-11 VITALS — BP 122/77 | HR 80 | Ht 63.0 in | Wt 218.0 lb

## 2017-11-11 DIAGNOSIS — Z0000 Encounter for general adult medical examination without abnormal findings: Secondary | ICD-10-CM | POA: Diagnosis not present

## 2017-11-11 DIAGNOSIS — N393 Stress incontinence (female) (male): Secondary | ICD-10-CM

## 2017-11-11 DIAGNOSIS — R350 Frequency of micturition: Secondary | ICD-10-CM

## 2017-11-11 DIAGNOSIS — N39490 Overflow incontinence: Secondary | ICD-10-CM | POA: Diagnosis not present

## 2017-11-11 DIAGNOSIS — Z01419 Encounter for gynecological examination (general) (routine) without abnormal findings: Secondary | ICD-10-CM

## 2017-11-11 LAB — POCT URINALYSIS DIPSTICK
Appearance: NORMAL
Bilirubin, UA: NEGATIVE
Blood, UA: NEGATIVE
Glucose, UA: NEGATIVE
Ketones, UA: NEGATIVE
LEUKOCYTES UA: NEGATIVE
NITRITE UA: NEGATIVE
PH UA: 6.5 (ref 5.0–8.0)
PROTEIN UA: NEGATIVE
SPEC GRAV UA: 1.025 (ref 1.010–1.025)
UROBILINOGEN UA: 0.2 U/dL

## 2017-11-11 NOTE — Progress Notes (Signed)
Subjective:     Tina StaggersJennifer M Bautista is a 42 y.o. female here for a routine exam.G3P3 pt is s/p c-section. LMP late July. Monthly cycles that have note changed. That    Current complaints: pt reports feeling 'very menopausal'.  She feels like she is gaining weight, has hot flashes, sensitive breasts- bilateral.   Pt was started on Celexa in July. TSH was normal. Pt reports leaking of urine only with coughing or laughing x 2 days after vacation. She reports a marked increase in caffeine consumption during her vacation. .     Gynecologic History Patient's last menstrual period was 10/18/2017. Contraception: condoms Last Pap: 07/2017. Results were: normal Last mammogram: 09/2017 Results were: normal  Obstetric History OB History  Gravida Para Term Preterm AB Living  3 3 3         SAB TAB Ectopic Multiple Live Births          3    # Outcome Date GA Lbr Len/2nd Weight Sex Delivery Anes PTL Lv  3 Term      CS-Unspec     2 Term      CS-Unspec     1 Term      CS-Unspec        The following portions of the patient's history were reviewed and updated as appropriate: allergies, current medications, past family history, past medical history, past social history, past surgical history and problem list.  Review of Systems Pertinent items are noted in HPI.    Objective:  BP 122/77   Pulse 80   Ht 5\' 3"  (1.6 m)   Wt 218 lb (98.9 kg)   LMP 10/18/2017   BMI 38.62 kg/m   General Appearance:    Alert, cooperative, no distress, appears stated age  Head:    Normocephalic, without obvious abnormality, atraumatic  Eyes:    conjunctiva/corneas clear, EOM's intact, both eyes  Ears:    Normal external ear canals, both ears  Nose:   Nares normal, septum midline, mucosa normal, no drainage    or sinus tenderness  Throat:   Lips, mucosa, and tongue normal; teeth and gums normal  Neck:   Supple, symmetrical, trachea midline, no adenopathy;    thyroid:  no enlargement/tenderness/nodules  Back:     Symmetric,  no curvature, ROM normal, no CVA tenderness  Lungs:     Clear to auscultation bilaterally, respirations unlabored  Chest Wall:    No tenderness or deformity   Heart:    Regular rate and rhythm, S1 and S2 normal, no murmur, rub   or gallop  Breast Exam:    No tenderness, masses, or nipple abnormality  Abdomen:     Soft, non-tender, bowel sounds active all four quadrants,    no masses, no organomegaly  Genitalia:    Normal female without lesion, discharge or tenderness; q-tip test neg <15 degrees     Extremities:   Extremities normal, atraumatic, no cyanosis or edema  Pulses:   2+ and symmetric all extremities  Skin:   Skin color, texture, turgor normal, no rashes or lesions     Assessment:    Healthy female exam.   Patient Active Problem List   Diagnosis Date Noted  . Physical exam 10/15/2017  . Obesity (BMI 35.0-39.9 without comorbidity) 10/15/2017  . Vitamin D deficiency 10/15/2017  . Depression, recurrent (HCC) 02/06/2017  incontinence- normal exam . UA neg  Plan:    Follow up in: 1 year.    Vit D 5000 daily Decrease  caffeine consumption  F/u if sx of incontinence return or do not resolve with decrease in caffeine.  Mammogram next week  Maizie Garno L. Harraway-Smith, M.D., Evern CoreFACOG

## 2017-11-16 ENCOUNTER — Telehealth: Payer: Self-pay | Admitting: Family Medicine

## 2017-11-16 MED ORDER — VENLAFAXINE HCL ER 75 MG PO TB24: 1.0000 | ORAL_TABLET | Freq: Every day | ORAL | 3 refills | Status: DC

## 2017-11-16 MED FILL — VENLAFAXINE HCL ER 75 MG CA: 75 | 30 days supply | Qty: 30 | Fill #0

## 2017-11-16 NOTE — Telephone Encounter (Signed)
Copied from CRM [phone]#[id number]. Topic: Quick Communication - Rx Refill/Question >> Nov 16, 2017 11:35 AM Alexander BergeronBarksdale, Harvey B wrote: Medication: venlafaxine XR (EFFEXOR-XR) 37.5 MG 24 hr capsule [[id number]][[id number]]   Pt called to let pcp know that the medication is fine; but she feels that she needs to increase her dosage, her OB physician also concurred; refill needed; contact pt if needed  [facility]

## 2017-11-16 NOTE — Telephone Encounter (Signed)
Ok to increase to Effexor XR 75mg  daily, #30, 3 refills and DC current 37.5mg  dose

## 2017-11-16 NOTE — Telephone Encounter (Signed)
Pt made aware, chart updated to reflect new medication. And Rx filled to local pharmacy as requested.

## 2017-11-16 NOTE — Telephone Encounter (Signed)
Please advise.   Patient is requesting to Increase her dosage.   Kathi SimpersAmy Ithzel Fedorchak,  LPN

## 2017-11-24 ENCOUNTER — Ambulatory Visit (HOSPITAL_BASED_OUTPATIENT_CLINIC_OR_DEPARTMENT_OTHER)
Admission: RE | Admit: 2017-11-24 | Discharge: 2017-11-24 | Disposition: A | Discharge status: Home / Self Care | Payer: 59 | Source: Ambulatory Visit | Attending: Obstetrics & Gynecology | Admitting: Obstetrics & Gynecology

## 2017-11-24 DIAGNOSIS — Z1231 Encounter for screening mammogram for malignant neoplasm of breast: Secondary | ICD-10-CM | POA: Diagnosis not present

## 2018-05-13 ENCOUNTER — Ambulatory Visit (INDEPENDENT_AMBULATORY_CARE_PROVIDER_SITE_OTHER): Payer: 59 | Admitting: Physician Assistant

## 2018-05-13 ENCOUNTER — Other Ambulatory Visit: Payer: Self-pay

## 2018-05-13 ENCOUNTER — Encounter: Payer: Self-pay | Admitting: Physician Assistant

## 2018-05-13 VITALS — BP 130/88 | HR 84 | Temp 98.1°F | Resp 16 | Ht 63.0 in | Wt 223.0 lb

## 2018-05-13 DIAGNOSIS — J019 Acute sinusitis, unspecified: Secondary | ICD-10-CM

## 2018-05-13 DIAGNOSIS — B9689 Other specified bacterial agents as the cause of diseases classified elsewhere: Secondary | ICD-10-CM | POA: Diagnosis not present

## 2018-05-13 MED ORDER — AMOXICILLIN-POT CLAVULANATE 875-125 MG PO TABS: 1.0000 | ORAL_TABLET | Freq: Two times a day (BID) | ORAL | 0 refills | Status: DC

## 2018-05-13 MED FILL — AMOX-CLAV 875-125 MG TABLET: 875-125 | 7 days supply | Qty: 14 | Fill #0

## 2018-05-13 NOTE — Patient Instructions (Signed)
Please take antibiotic as directed.  Increase fluid intake.  Use Saline nasal spray.  Take a daily multivitamin. Ok to alternate Tylenol and Ibuprofen if needed. Continue Claritin.  Place a humidifier in the bedroom.  Please call or return clinic if symptoms are not improving.  Sinusitis Sinusitis is redness, soreness, and swelling (inflammation) of the paranasal sinuses. Paranasal sinuses are air pockets within the bones of your face (beneath the eyes, the middle of the forehead, or above the eyes). In healthy paranasal sinuses, mucus is able to drain out, and air is able to circulate through them by way of your nose. However, when your paranasal sinuses are inflamed, mucus and air can become trapped. This can allow bacteria and other germs to grow and cause infection. Sinusitis can develop quickly and last only a short time (acute) or continue over a long period (chronic). Sinusitis that lasts for more than 12 weeks is considered chronic.  CAUSES  Causes of sinusitis include:  Allergies.  Structural abnormalities, such as displacement of the cartilage that separates your nostrils (deviated septum), which can decrease the air flow through your nose and sinuses and affect sinus drainage.  Functional abnormalities, such as when the small hairs (cilia) that line your sinuses and help remove mucus do not work properly or are not present. SYMPTOMS  Symptoms of acute and chronic sinusitis are the same. The primary symptoms are pain and pressure around the affected sinuses. Other symptoms include:  Upper toothache.  Earache.  Headache.  Bad breath.  Decreased sense of smell and taste.  A cough, which worsens when you are lying flat.  Fatigue.  Fever.  Thick drainage from your nose, which often is green and may contain pus (purulent).  Swelling and warmth over the affected sinuses. DIAGNOSIS  Your caregiver will perform a physical exam. During the exam, your caregiver may:  Look in  your nose for signs of abnormal growths in your nostrils (nasal polyps).  Tap over the affected sinus to check for signs of infection.  View the inside of your sinuses (endoscopy) with a special imaging device with a light attached (endoscope), which is inserted into your sinuses. If your caregiver suspects that you have chronic sinusitis, one or more of the following tests may be recommended:  Allergy tests.  Nasal culture A sample of mucus is taken from your nose and sent to a lab and screened for bacteria.  Nasal cytology A sample of mucus is taken from your nose and examined by your caregiver to determine if your sinusitis is related to an allergy. TREATMENT  Most cases of acute sinusitis are related to a viral infection and will resolve on their own within 10 days. Sometimes medicines are prescribed to help relieve symptoms (pain medicine, decongestants, nasal steroid sprays, or saline sprays).  However, for sinusitis related to a bacterial infection, your caregiver will prescribe antibiotic medicines. These are medicines that will help kill the bacteria causing the infection.  Rarely, sinusitis is caused by a fungal infection. In theses cases, your caregiver will prescribe antifungal medicine. For some cases of chronic sinusitis, surgery is needed. Generally, these are cases in which sinusitis recurs more than 3 times per year, despite other treatments. HOME CARE INSTRUCTIONS   Drink plenty of water. Water helps thin the mucus so your sinuses can drain more easily.  Use a humidifier.  Inhale steam 3 to 4 times a day (for example, sit in the bathroom with the shower running).  Apply a warm, moist  washcloth to your face 3 to 4 times a day, or as directed by your caregiver.  Use saline nasal sprays to help moisten and clean your sinuses.  Take over-the-counter or prescription medicines for pain, discomfort, or fever only as directed by your caregiver. SEEK IMMEDIATE MEDICAL CARE  IF:  You have increasing pain or severe headaches.  You have nausea, vomiting, or drowsiness.  You have swelling around your face.  You have vision problems.  You have a stiff neck.  You have difficulty breathing. MAKE SURE YOU:   Understand these instructions.  Will watch your condition.  Will get help right away if you are not doing well or get worse. Document Released: 03/17/2005 Document Revised: 06/09/2011 Document Reviewed: 04/01/2011 [facility] Patient Information 2014 [place], [place].

## 2018-05-13 NOTE — Progress Notes (Signed)
Patient presents to clinic today c/o 2 weeks of nasal congestion, ear pressure and L ear pain with sore throat starting Tuesday. Endorses bilateral sore throat with odynopahgia. Denies dysphagia. Denies fever, chills or aches. Denies recent travel or sick contact. Has been taking Claritin for symptoms.   Past Medical History:  Diagnosis Date  . Allergy     No current outpatient medications on file prior to visit.   No current facility-administered medications on file prior to visit.     No Known Allergies  Family History  Problem Relation Age of Onset  . Cirrhosis Mother   . Healthy Father   . Breast cancer Neg Hx     Social History   Socioeconomic History  . Marital status: Married    Spouse name: Not on file  . Number of children: Not on file  . Years of education: Not on file  . Highest education level: Not on file  Occupational History  . Not on file  Social Needs  . Financial resource strain: Not on file  . Food insecurity:    Worry: Not on file    Inability: Not on file  . Transportation needs:    Medical: Not on file    Non-medical: Not on file  Tobacco Use  . Smoking status: Never Smoker  . Smokeless tobacco: Never Used  Substance and Sexual Activity  . Alcohol use: Yes  . Drug use: No  . Sexual activity: Yes    Birth control/protection: None  Lifestyle  . Physical activity:    Days per week: Not on file    Minutes per session: Not on file  . Stress: Not on file  Relationships  . Social connections:    Talks on phone: Not on file    Gets together: Not on file    Attends religious service: Not on file    Active member of club or organization: Not on file    Attends meetings of clubs or organizations: Not on file    Relationship status: Not on file  Other Topics Concern  . Not on file  Social History Narrative  . Not on file   Review of Systems - See HPI.  All other ROS are negative.  BP 130/88   Pulse 84   Temp 98.1 F (36.7 C) (Oral)    Resp 16   Ht 5\' 3"  (1.6 m)   Wt 223 lb (101.2 kg)   SpO2 98%   BMI 39.50 kg/m   Physical Exam Vitals signs reviewed.  Constitutional:      Appearance: Normal appearance.  HENT:     Head: Normocephalic and atraumatic.     Right Ear: Tympanic membrane normal.     Left Ear: Tympanic membrane normal.     Nose:     Right Turbinates: Enlarged and swollen.     Left Turbinates: Enlarged and swollen.     Right Sinus: Maxillary sinus tenderness present.     Left Sinus: Maxillary sinus tenderness present.  Neck:     Musculoskeletal: Neck supple.  Cardiovascular:     Rate and Rhythm: Normal rate and regular rhythm.     Heart sounds: Normal heart sounds.  Pulmonary:     Effort: Pulmonary effort is normal.     Breath sounds: Normal breath sounds.  Neurological:     General: No focal deficit present.     Mental Status: She is alert and oriented to person, place, and time.     Assessment/Plan: 1.  Acute bacterial sinusitis Rx Augmentin.  Increase fluids.  Rest.  Saline nasal spray.  Probiotic.  Mucinex as directed.  Humidifier in bedroom.  Call or return to clinic if symptoms are not improving.  - amoxicillin-clavulanate (AUGMENTIN) 875-125 MG tablet; Take 1 tablet by mouth 2 (two) times daily.  Dispense: 14 tablet; Refill: 0   Piedad Climes, PA-C

## 2018-05-27 ENCOUNTER — Ambulatory Visit (INDEPENDENT_AMBULATORY_CARE_PROVIDER_SITE_OTHER): Payer: 59 | Admitting: Family Medicine

## 2018-05-27 ENCOUNTER — Encounter: Payer: Self-pay | Admitting: Family Medicine

## 2018-05-27 ENCOUNTER — Other Ambulatory Visit: Payer: Self-pay

## 2018-05-27 DIAGNOSIS — J309 Allergic rhinitis, unspecified: Secondary | ICD-10-CM | POA: Insufficient documentation

## 2018-05-27 MED ORDER — MONTELUKAST SODIUM 10 MG PO TABS: 10.0000 mg | ORAL_TABLET | Freq: Every day | ORAL | 1 refills | Status: DC

## 2018-05-27 MED FILL — MONTELUKAST SOD 10 MG TAB: 10 | 90 days supply | Qty: 90 | Fill #0

## 2018-05-27 NOTE — Progress Notes (Signed)
   Subjective:    Patient ID: JEANIE MARCOUX, female    DOB: Jul 05, 1975, 43 y.o.   MRN: [id number]  HPI Allergic rhinitis- pt has hx of this.  Was previously on Singulair but stopped b/c she forgot to get meds refilled.  Currently taking OTC Claritin and Benadryl but this causes sleepiness.  Sxs respond well to Singulair.  Eyes will often itch- and Singulair improves this.   Review of Systems For ROS see HPI     Objective:   Physical Exam Vitals signs reviewed.  Constitutional:      General: She is not in acute distress.    Appearance: Normal appearance. She is well-developed.  HENT:     Head: Normocephalic and atraumatic.     Right Ear: Tympanic membrane normal.     Left Ear: Tympanic membrane normal.     Nose: Mucosal edema and rhinorrhea present.     Right Sinus: No maxillary sinus tenderness or frontal sinus tenderness.     Left Sinus: No maxillary sinus tenderness or frontal sinus tenderness.     Mouth/Throat:     Pharynx: Posterior oropharyngeal erythema (w/ PND) present.  Eyes:     Conjunctiva/sclera: Conjunctivae normal.     Pupils: Pupils are equal, round, and reactive to light.  Neck:     Musculoskeletal: Normal range of motion and neck supple.  Cardiovascular:     Rate and Rhythm: Normal rate and regular rhythm.     Heart sounds: Normal heart sounds.  Pulmonary:     Effort: Pulmonary effort is normal. No respiratory distress.     Breath sounds: Normal breath sounds. No wheezing or rales.  Lymphadenopathy:     Cervical: No cervical adenopathy.  Neurological:     Mental Status: She is alert.           Assessment & Plan:

## 2018-05-27 NOTE — Patient Instructions (Signed)
Follow up as needed or as scheduled RESTART the Singulair nightly If symptoms persist, add the Claritin or Zyrtec If the eye symptoms are the most bothersome, let me know and we can do Pataday Drink plenty of fluids Call with any questions or concerns Have a great weekend!!

## 2018-05-27 NOTE — Assessment & Plan Note (Signed)
New to provider, ongoing for pt.  Restart Singulair nightly.  Discussed combo therapy w/ Claritin and Pataday if needed.  Reviewed supportive care and red flags that should prompt return.  Pt expressed understanding and is in agreement w/ plan.

## 2018-08-27 ENCOUNTER — Ambulatory Visit (INDEPENDENT_AMBULATORY_CARE_PROVIDER_SITE_OTHER): Payer: 59 | Admitting: Family Medicine

## 2018-08-27 ENCOUNTER — Encounter: Payer: Self-pay | Admitting: Family Medicine

## 2018-08-27 ENCOUNTER — Other Ambulatory Visit: Payer: Self-pay

## 2018-08-27 VITALS — Temp 98.6°F | Ht 63.0 in | Wt 219.0 lb

## 2018-08-27 DIAGNOSIS — W57XXXA Bitten or stung by nonvenomous insect and other nonvenomous arthropods, initial encounter: Secondary | ICD-10-CM

## 2018-08-27 DIAGNOSIS — B029 Zoster without complications: Secondary | ICD-10-CM

## 2018-08-27 DIAGNOSIS — S40261A Insect bite (nonvenomous) of right shoulder, initial encounter: Secondary | ICD-10-CM | POA: Diagnosis not present

## 2018-08-27 MED ORDER — VALACYCLOVIR HCL 1 G PO TABS: 1000.0000 mg | ORAL_TABLET | Freq: Three times a day (TID) | ORAL | 0 refills | Status: DC

## 2018-08-27 MED ORDER — TRIAMCINOLONE ACETONIDE 0.1 % EX OINT: 1.0000 "application " | TOPICAL_OINTMENT | Freq: Two times a day (BID) | CUTANEOUS | 1 refills | Status: DC

## 2018-08-27 MED FILL — TRIAMCINOLONE 0.1% OINTMENT: 0.1 | 25 days supply | Qty: 90 | Fill #0

## 2018-08-27 MED FILL — MONTELUKAST SOD 10 MG TAB: 10 | 90 days supply | Qty: 90 | Fill #1

## 2018-08-27 MED FILL — valACYclovir HCL 1 GM TABS: 1 | 7 days supply | Qty: 21 | Fill #0

## 2018-08-27 NOTE — Progress Notes (Signed)
Pre visit review using our clinic review tool, if applicable. No additional management support is needed unless otherwise documented below in the visit note. 

## 2018-08-27 NOTE — Progress Notes (Signed)
   Virtual Visit via Video   I connected with patient on 08/27/18 at  9:00 AM EDT by a video enabled telemedicine application and verified that I am speaking with the correct person using two identifiers.  Location patient: Home Location provider: [facility], Office Persons participating in the virtual visit: Patient, Provider, CMA Toma Copier D)  I discussed the limitations of evaluation and management by telemedicine and the availability of in person appointments. The patient expressed understanding and agreed to proceed.  Subjective:   HPI:   Tick bite- bite occurred Monday night.  R shoulder blade.  Tick came out easily, not engorged.  This week has had increased itching, redness.  No bullseye.  No fevers.  Has spreading red rash that is burning and extending under R axilla.  ROS:   See pertinent positives and negatives per HPI.  Patient Active Problem List   Diagnosis Date Noted  . Allergic rhinitis 05/27/2018  . Physical exam 10/15/2017  . Obesity (BMI 35.0-39.9 without comorbidity) 10/15/2017  . Vitamin D deficiency 10/15/2017  . Depression, recurrent (HCC) 02/06/2017    Social History   Tobacco Use  . Smoking status: Never Smoker  . Smokeless tobacco: Never Used  Substance Use Topics  . Alcohol use: Yes    Current Outpatient Medications:  .  montelukast (SINGULAIR) 10 MG tablet, Take 1 tablet (10 mg total) by mouth at bedtime., Disp: 90 tablet, Rfl: 1  No Known Allergies  Objective:   Temp 98.6 F (37 C)   Ht 5\' 3"  (1.6 m)   Wt 219 lb (99.3 kg)   BMI 38.79 kg/m   AAOx3, NAD NCAT, EOMI No obvious CN deficits Coloring WNL Pt is able to speak clearly, coherently without shortness of breath or increased work of breathing.  Thought process is linear.  Mood is appropriate.  Vesicular rash on R scapula w/ linear extension medially towards spine and laterally under arm  Assessment and Plan:   Tick bite- incidental  Shingles- new.  Tick bite is a  red herring.  Rash is consistent w/ shingles.  When questioned, pt admits to viral prodrome.  Rash appeared on Wednesday so we are within the window to start Valtrex.  Reviewed supportive care and red flags that should prompt return.  Pt expressed understanding and is in agreement w/ plan.    Neena Rhymes, MD 08/27/2018

## 2018-10-04 ENCOUNTER — Other Ambulatory Visit (HOSPITAL_BASED_OUTPATIENT_CLINIC_OR_DEPARTMENT_OTHER): Payer: Self-pay | Admitting: Family Medicine

## 2018-10-04 DIAGNOSIS — Z1231 Encounter for screening mammogram for malignant neoplasm of breast: Secondary | ICD-10-CM

## 2018-10-18 ENCOUNTER — Encounter: Payer: Self-pay | Admitting: Family Medicine

## 2018-10-18 ENCOUNTER — Ambulatory Visit (INDEPENDENT_AMBULATORY_CARE_PROVIDER_SITE_OTHER): Payer: 59 | Admitting: Family Medicine

## 2018-10-18 ENCOUNTER — Other Ambulatory Visit: Payer: Self-pay

## 2018-10-18 VITALS — BP 122/80 | HR 98 | Temp 98.0°F | Resp 16 | Ht 63.0 in | Wt 218.4 lb

## 2018-10-18 DIAGNOSIS — E559 Vitamin D deficiency, unspecified: Secondary | ICD-10-CM

## 2018-10-18 DIAGNOSIS — Z0000 Encounter for general adult medical examination without abnormal findings: Secondary | ICD-10-CM

## 2018-10-18 DIAGNOSIS — E669 Obesity, unspecified: Secondary | ICD-10-CM

## 2018-10-18 LAB — CBC WITH DIFFERENTIAL/PLATELET
Basophils Absolute: 0 10*3/uL (ref 0.0–0.1)
Basophils Relative: 0.6 % (ref 0.0–3.0)
Eosinophils Absolute: 0.4 10*3/uL (ref 0.0–0.7)
Eosinophils Relative: 8 % — ABNORMAL HIGH (ref 0.0–5.0)
HCT: 42.7 % (ref 36.0–46.0)
Hemoglobin: 14.3 g/dL (ref 12.0–15.0)
Lymphocytes Relative: 23.6 % (ref 12.0–46.0)
Lymphs Abs: 1.3 10*3/uL (ref 0.7–4.0)
MCHC: 33.5 g/dL (ref 30.0–36.0)
MCV: 88.4 fl (ref 78.0–100.0)
Monocytes Absolute: 0.4 10*3/uL (ref 0.1–1.0)
Monocytes Relative: 7 % (ref 3.0–12.0)
Neutro Abs: 3.4 10*3/uL (ref 1.4–7.7)
Neutrophils Relative %: 60.8 % (ref 43.0–77.0)
Platelets: 245 10*3/uL (ref 150.0–400.0)
RBC: 4.83 Mil/uL (ref 3.87–5.11)
RDW: 13.7 % (ref 11.5–15.5)
WBC: 5.6 10*3/uL (ref 4.0–10.5)

## 2018-10-18 LAB — LIPID PANEL
Cholesterol: 245 mg/dL — ABNORMAL HIGH (ref 0–200)
HDL: 53.1 mg/dL (ref >39.00)
LDL Cholesterol: 168 mg/dL — ABNORMAL HIGH (ref 0–99)
NonHDL: 191.95
Total CHOL/HDL Ratio: 5
Triglycerides: 119 mg/dL (ref 0.0–149.0)
VLDL: 23.8 mg/dL (ref 0.0–40.0)

## 2018-10-18 LAB — HEPATIC FUNCTION PANEL
ALT: 16 U/L (ref 0–35)
AST: 18 U/L (ref 0–37)
Albumin: 4.6 g/dL (ref 3.5–5.2)
Alkaline Phosphatase: 47 U/L (ref 39–117)
Bilirubin, Direct: 0.1 mg/dL (ref 0.0–0.3)
Total Bilirubin: 0.6 mg/dL (ref 0.2–1.2)
Total Protein: 6.9 g/dL (ref 6.0–8.3)

## 2018-10-18 LAB — BASIC METABOLIC PANEL
BUN: 10 mg/dL (ref 6–23)
CO2: 24 mEq/L (ref 19–32)
Calcium: 9.2 mg/dL (ref 8.4–10.5)
Chloride: 106 mEq/L (ref 96–112)
Creatinine, Ser: 0.74 mg/dL (ref 0.40–1.20)
GFR: 85.22 mL/min (ref >60.00)
Glucose, Bld: 95 mg/dL (ref 70–99)
Potassium: 4.7 mEq/L (ref 3.5–5.1)
Sodium: 139 mEq/L (ref 135–145)

## 2018-10-18 LAB — TSH: TSH: 2.12 u[IU]/mL (ref 0.35–4.50)

## 2018-10-18 LAB — VITAMIN D 25 HYDROXY (VIT D DEFICIENCY, FRACTURES): VITD: 25.61 ng/mL — ABNORMAL LOW (ref 30.00–100.00)

## 2018-10-18 NOTE — Assessment & Plan Note (Signed)
Check labs and replete prn. 

## 2018-10-18 NOTE — Assessment & Plan Note (Signed)
Ongoing issue for pt.  Stressed need for healthy diet and regular exercise.  Check labs to risk stratify.  Will follow 

## 2018-10-18 NOTE — Patient Instructions (Signed)
Follow up in 1 year or as needed We'll notify you of your lab results and make any changes if needed Continue to work on healthy diet and regular exercise- you can do it!! Call with any questions or concerns Stay Safe!!! 

## 2018-10-18 NOTE — Progress Notes (Signed)
   Subjective:    Patient ID: Tina Bautista, female    DOB: Oct 01, 1975, 43 y.o.   MRN: [id number]  HPI CPE- UTD on pap, mammo, Tdap.   Review of Systems Patient reports no vision/ hearing changes, adenopathy,fever, weight change,  persistant/recurrent hoarseness , swallowing issues, chest pain, palpitations, edema, persistant/recurrent cough, hemoptysis, dyspnea (rest/exertional/paroxysmal nocturnal), gastrointestinal bleeding (melena, rectal bleeding), abdominal pain, significant heartburn, bowel changes, GU symptoms (dysuria, hematuria, incontinence), Gyn symptoms (abnormal  bleeding, pain),  syncope, focal weakness, memory loss, numbness & tingling, skin/hair/nail changes, abnormal bruising or bleeding, anxiety, or depression.     Objective:   Physical Exam General Appearance:    Alert, cooperative, no distress, appears stated age, obese  Head:    Normocephalic, without obvious abnormality, atraumatic  Eyes:    PERRL, conjunctiva/corneas clear, EOM's intact, fundi    benign, both eyes  Ears:    Normal TM's and external ear canals, both ears  Nose:   Nares normal, septum midline, mucosa normal, no drainage    or sinus tenderness  Throat:   Lips, mucosa, and tongue normal; teeth and gums normal  Neck:   Supple, symmetrical, trachea midline, no adenopathy;    Thyroid: no enlargement/tenderness/nodules  Back:     Symmetric, no curvature, ROM normal, no CVA tenderness  Lungs:     Clear to auscultation bilaterally, respirations unlabored  Chest Wall:    No tenderness or deformity   Heart:    Regular rate and rhythm, S1 and S2 normal, no murmur, rub   or gallop  Breast Exam:    Deferred to GYN  Abdomen:     Soft, non-tender, bowel sounds active all four quadrants,    no masses, no organomegaly  Genitalia:    Deferred to GYN  Rectal:    Extremities:   Extremities normal, atraumatic, no cyanosis or edema  Pulses:   2+ and symmetric all extremities  Skin:   Skin color, texture, turgor  normal, no rashes or lesions  Lymph nodes:   Cervical, supraclavicular, and axillary nodes normal  Neurologic:   CNII-XII intact, normal strength, sensation and reflexes    throughout          Assessment & Plan:

## 2018-10-18 NOTE — Assessment & Plan Note (Signed)
Pt's PE WNL w/ exception of obesity.  UTD on GYN, immunizations.  Check labs.  Anticipatory guidance provided.  °

## 2018-10-19 ENCOUNTER — Other Ambulatory Visit: Payer: Self-pay | Admitting: Family Medicine

## 2018-10-19 DIAGNOSIS — E785 Hyperlipidemia, unspecified: Secondary | ICD-10-CM

## 2018-10-19 MED ORDER — VITAMIN D (ERGOCALCIFEROL) 1.25 MG (50000 UNIT) PO CAPS: 50000.0000 [IU] | ORAL_CAPSULE | ORAL | 0 refills | Status: DC

## 2018-10-19 MED ORDER — ATORVASTATIN CALCIUM 20 MG PO TABS: 20.0000 mg | ORAL_TABLET | Freq: Every day | ORAL | 6 refills | Status: DC

## 2018-10-19 MED FILL — VIT D2 1.25 MG (50,000 UNIT: 1.25 MG | 84 days supply | Qty: 12 | Fill #0

## 2018-10-19 MED FILL — ATORVASTATIN 20 MG TABLET: 20 | 30 days supply | Qty: 30 | Fill #0

## 2018-10-29 ENCOUNTER — Other Ambulatory Visit: Payer: Self-pay

## 2018-10-29 ENCOUNTER — Encounter: Payer: Self-pay | Admitting: Obstetrics & Gynecology

## 2018-10-29 ENCOUNTER — Encounter: Payer: 59 | Admitting: Obstetrics & Gynecology

## 2018-10-29 NOTE — Progress Notes (Signed)
Pt was early for an annual visit.  Not seen today.

## 2018-11-22 ENCOUNTER — Encounter: Payer: 59 | Admitting: Obstetrics & Gynecology

## 2018-11-24 MED FILL — ATORVASTATIN 20 MG TABLET: 20 | 30 days supply | Qty: 30 | Fill #1

## 2018-11-30 DIAGNOSIS — N943 Premenstrual tension syndrome: Secondary | ICD-10-CM | POA: Diagnosis not present

## 2018-11-30 DIAGNOSIS — Z124 Encounter for screening for malignant neoplasm of cervix: Secondary | ICD-10-CM | POA: Diagnosis not present

## 2018-11-30 DIAGNOSIS — R232 Flushing: Secondary | ICD-10-CM | POA: Diagnosis not present

## 2018-11-30 DIAGNOSIS — Z1151 Encounter for screening for human papillomavirus (HPV): Secondary | ICD-10-CM | POA: Diagnosis not present

## 2018-11-30 DIAGNOSIS — Z01419 Encounter for gynecological examination (general) (routine) without abnormal findings: Secondary | ICD-10-CM | POA: Diagnosis not present

## 2018-12-01 ENCOUNTER — Other Ambulatory Visit: Payer: Self-pay

## 2018-12-01 ENCOUNTER — Ambulatory Visit (INDEPENDENT_AMBULATORY_CARE_PROVIDER_SITE_OTHER): Payer: 59

## 2018-12-01 DIAGNOSIS — E785 Hyperlipidemia, unspecified: Secondary | ICD-10-CM

## 2018-12-01 LAB — HEPATIC FUNCTION PANEL
ALT: 20 U/L (ref 0–35)
AST: 17 U/L (ref 0–37)
Albumin: 4.5 g/dL (ref 3.5–5.2)
Alkaline Phosphatase: 57 U/L (ref 39–117)
Bilirubin, Direct: 0.2 mg/dL (ref 0.0–0.3)
Total Bilirubin: 1 mg/dL (ref 0.2–1.2)
Total Protein: 7.2 g/dL (ref 6.0–8.3)

## 2018-12-02 ENCOUNTER — Ambulatory Visit (HOSPITAL_BASED_OUTPATIENT_CLINIC_OR_DEPARTMENT_OTHER): Payer: 59

## 2018-12-21 ENCOUNTER — Encounter (HOSPITAL_BASED_OUTPATIENT_CLINIC_OR_DEPARTMENT_OTHER): Payer: Self-pay

## 2018-12-21 ENCOUNTER — Ambulatory Visit (HOSPITAL_BASED_OUTPATIENT_CLINIC_OR_DEPARTMENT_OTHER)
Admission: RE | Admit: 2018-12-21 | Discharge: 2018-12-21 | Disposition: A | Discharge status: Home / Self Care | Payer: 59 | Source: Ambulatory Visit | Attending: Family Medicine | Admitting: Family Medicine

## 2018-12-21 ENCOUNTER — Other Ambulatory Visit: Payer: Self-pay

## 2018-12-21 DIAGNOSIS — Z1231 Encounter for screening mammogram for malignant neoplasm of breast: Secondary | ICD-10-CM | POA: Diagnosis not present

## 2019-01-04 MED FILL — ATORVASTATIN 20 MG TABLET: 20 | 30 days supply | Qty: 30 | Fill #2

## 2019-02-22 ENCOUNTER — Other Ambulatory Visit: Payer: Self-pay | Admitting: *Deleted

## 2019-02-22 MED ORDER — MONTELUKAST SODIUM 10 MG PO TABS: 10.0000 mg | ORAL_TABLET | Freq: Every day | ORAL | 1 refills | Status: DC

## 2019-02-22 MED FILL — ATORVASTATIN 20 MG TABLET: 20 | 30 days supply | Qty: 30 | Fill #3

## 2019-02-22 MED FILL — MONTELUKAST SOD 10 MG TAB: 10 | 90 days supply | Qty: 90 | Fill #0

## 2019-05-10 MED FILL — ATORVASTATIN 20 MG TABLET: 20 | 30 days supply | Qty: 30 | Fill #4

## 2019-07-07 ENCOUNTER — Telehealth (INDEPENDENT_AMBULATORY_CARE_PROVIDER_SITE_OTHER): Payer: 59 | Admitting: Physician Assistant

## 2019-07-07 ENCOUNTER — Encounter: Payer: Self-pay | Admitting: Physician Assistant

## 2019-07-07 ENCOUNTER — Other Ambulatory Visit: Payer: Self-pay

## 2019-07-07 DIAGNOSIS — L719 Rosacea, unspecified: Secondary | ICD-10-CM | POA: Diagnosis not present

## 2019-07-07 MED ORDER — METRONIDAZOLE 0.75 % EX GEL: 1.0000 "application " | Freq: Two times a day (BID) | CUTANEOUS | 0 refills | Status: DC

## 2019-07-07 MED FILL — metroNIDAZOLE 0.75 % GEL: 0.75 | 30 days supply | Qty: 45 | Fill #0

## 2019-07-07 NOTE — Progress Notes (Signed)
I have discussed the procedure for the virtual visit with the patient who has given consent to proceed with assessment and treatment.   Ayan Heffington S Roopa Graver, CMA     

## 2019-07-07 NOTE — Progress Notes (Signed)
   Virtual Visit via Video   I connected with patient on 07/07/19 at 10:00 AM EDT by a video enabled telemedicine application and verified that I am speaking with the correct person using two identifiers.  Location patient: Home Location provider: [facility], Office Persons participating in the virtual visit: Patient, Provider, CMA (Patina Moore)  I discussed the limitations of evaluation and management by telemedicine and the availability of in person appointments. The patient expressed understanding and agreed to proceed.  Subjective:   HPI:   Patient presents via Caregility to discuss ongoing issue with rosacea.  Patient notes an almost 10-year history of rosacea, intermittent.  Is typically mild when present and so she typically will use make-up to cover up the area until the flare calms down.  Over the past year her flareups have become more frequent and more severe, which she thinks is related to having to wear a mask.  Notes erythema of cheeks with associated papules and pustules.  Has tried several over-the-counter creams and lotions without much improvement.  Denies any dryness or flaking of the skin.  Denies any history of eczema or perioral dermatitis.  ROS:   See pertinent positives and negatives per HPI.  Patient Active Problem List   Diagnosis Date Noted  . Allergic rhinitis 05/27/2018  . Physical exam 10/15/2017  . Obesity (BMI 35.0-39.9 without comorbidity) 10/15/2017  . Vitamin D deficiency 10/15/2017    Social History   Tobacco Use  . Smoking status: Never Smoker  . Smokeless tobacco: Never Used  Substance Use Topics  . Alcohol use: Yes    Current Outpatient Medications:  .  atorvastatin (LIPITOR) 20 MG tablet, Take 1 tablet (20 mg total) by mouth daily., Disp: 30 tablet, Rfl: 6 .  Cholecalciferol (VITAMIN D3) 125 MCG (5000 UT) CAPS, Take 1 capsule by mouth daily., Disp: , Rfl:  .  montelukast (SINGULAIR) 10 MG tablet, Take 1 tablet (10 mg total) by  mouth at bedtime., Disp: 90 tablet, Rfl: 1 .  UNABLE TO FIND, Med Name: Low Dose Naltrexone - LDN (compounded)  4.5 mg capsules, 1 PO qHS, #30, Disp: , Rfl:   No Known Allergies  Objective:   There were no vitals taken for this visit.  Patient is well-developed, well-nourished in no acute distress.  Resting comfortably at home.  Head is normocephalic, atraumatic.  No labored breathing.  Speech is clear and coherent with logical content.  Patient is alert and oriented at baseline.  Facial erythema noted bilaterally with right greater than left.  Some papular/pustular lesions noted of right cheek.  Assessment and Plan:   1. Rosacea Examination today most consistent with mild to moderate flare of rosacea.  Discussed avoidance of potential triggers.  Recommend good facial moisturizer with SPF.  Will start trial of MetroGel twice daily over the next couple of weeks.  She is to let us know how things are going.  If not improving will need referral to dermatology.    Piedad Climes, [place] 07/07/2019

## 2019-07-20 ENCOUNTER — Ambulatory Visit: Payer: 59 | Admitting: Family Medicine

## 2019-07-21 ENCOUNTER — Ambulatory Visit: Payer: 59 | Admitting: Family Medicine

## 2019-07-21 DIAGNOSIS — Z0289 Encounter for other administrative examinations: Secondary | ICD-10-CM

## 2019-07-21 MED FILL — ATORVASTATIN 20 MG TABLET: 20 | 30 days supply | Qty: 30 | Fill #5

## 2019-08-12 DIAGNOSIS — Z20828 Contact with and (suspected) exposure to other viral communicable diseases: Secondary | ICD-10-CM | POA: Diagnosis not present

## 2019-08-12 DIAGNOSIS — Z03818 Encounter for observation for suspected exposure to other biological agents ruled out: Secondary | ICD-10-CM | POA: Diagnosis not present

## 2019-08-15 MED FILL — MONTELUKAST SOD 10 MG TAB: 10 | 90 days supply | Qty: 90 | Fill #1

## 2019-08-16 ENCOUNTER — Other Ambulatory Visit: Payer: Self-pay

## 2019-08-16 ENCOUNTER — Telehealth (INDEPENDENT_AMBULATORY_CARE_PROVIDER_SITE_OTHER): Payer: 59 | Admitting: Family Medicine

## 2019-08-16 DIAGNOSIS — J329 Chronic sinusitis, unspecified: Secondary | ICD-10-CM | POA: Diagnosis not present

## 2019-08-16 MED ORDER — DOXYCYCLINE HYCLATE 100 MG PO TABS: 100.0000 mg | ORAL_TABLET | Freq: Two times a day (BID) | ORAL | 0 refills | Status: DC

## 2019-08-16 MED FILL — DOXYCYCLINE HYCLATE 100 MG: 100 | 7 days supply | Qty: 14 | Fill #0

## 2019-08-16 NOTE — Progress Notes (Signed)
Virtual Visit via Video Note  I connected with Tina Bautista  on 08/16/19 at  1:00 PM EDT by a video enabled telemedicine application and verified that I am speaking with the correct person using two identifiers.  Location patient: home, [place] Location provider:work or home office Persons participating in the virtual visit: patient, provider  I discussed the limitations of evaluation and management by telemedicine and the availability of in person appointments. The patient expressed understanding and agreed to proceed.   HPI:  Acute visit for "a sinus infection" -Started about 11-12 days ago -symptoms include sinus congestion, thick discolored bad tasting sinus drainage now, sinus discomfort, low grade temps last week -had negative COVID19 test 5-7 days into getting sick -she got a cold from her son, but he got better -Denies body aches, SOB, hemoptysis, NVD, loss of taste or smell -feels like a sinus infection to her - has been a long time since she had one or used an antibiotic  -no allergies to antibiotics -denies chance of pregancy  ROS: See pertinent positives and negatives per HPI.  Past Medical History:  Diagnosis Date  . Allergy     Past Surgical History:  Procedure Laterality Date  . CESAREAN SECTION     3    Family History  Problem Relation Age of Onset  . Cirrhosis Mother   . Healthy Father   . Breast cancer Neg Hx     SOCIAL HX: see hpi   Current Outpatient Medications:  .  atorvastatin (LIPITOR) 20 MG tablet, Take 1 tablet (20 mg total) by mouth daily., Disp: 30 tablet, Rfl: 6 .  Cholecalciferol (VITAMIN D3) 125 MCG (5000 UT) CAPS, Take 1 capsule by mouth daily., Disp: , Rfl:  .  doxycycline (VIBRA-TABS) 100 MG tablet, Take 1 tablet (100 mg total) by mouth 2 (two) times daily., Disp: 14 tablet, Rfl: 0 .  metroNIDAZOLE (METROGEL) 0.75 % gel, Apply 1 application topically 2 (two) times daily., Disp: 45 g, Rfl: 0 .  montelukast (SINGULAIR) 10 MG tablet, Take 1  tablet (10 mg total) by mouth at bedtime., Disp: 90 tablet, Rfl: 1 .  UNABLE TO FIND, Med Name: Low Dose Naltrexone - LDN (compounded)  4.5 mg capsules, 1 PO qHS, #30, Disp: , Rfl:   EXAM:  VITALS per patient if applicable:  GENERAL: alert, oriented, appears well and in no acute distress  HEENT: atraumatic, conjunttiva clear, no obvious abnormalities on inspection of external nose and ears  NECK: normal movements of the head and neck  LUNGS: on inspection no signs of respiratory distress, breathing rate appears normal, no obvious gross SOB, gasping or wheezing  CV: no obvious cyanosis  MS: moves all visible extremities without noticeable abnormality  PSYCH/NEURO: pleasant and cooperative, no obvious depression or anxiety, speech and thought processing grossly intact  ASSESSMENT AND PLAN:  Discussed the following assessment and plan:  Sinusitis, unspecified chronicity, unspecified location  -we discussed possible serious and likely etiologies, options for evaluation and workup, limitations of telemedicine visit vs in person visit, treatment, treatment risks and precautions. Pt prefers to treat via telemedicine empirically rather then risking or undertaking an in person visit at this moment. Suspect sinusitis given symptoms and duration of illness, vs other. She opted for empiric treatment with nasal saline and Doxy 100mg  bid x 7 days. Patient agrees to seek prompt in person care if worsening, new symptoms arise, or if is not improving with treatment.   I discussed the assessment and treatment plan with the patient.  The patient was provided an opportunity to ask questions and all were answered. The patient agreed with the plan and demonstrated an understanding of the instructions.   The patient was advised to call back or seek an in-person evaluation if the symptoms worsen or if the condition fails to improve as anticipated.   Lucretia Kern, DO

## 2019-08-16 NOTE — Patient Instructions (Signed)
-  I sent the medication(s) we discussed to your pharmacy: Meds ordered this encounter  Medications  . doxycycline (VIBRA-TABS) 100 MG tablet    Sig: Take 1 tablet (100 mg total) by mouth 2 (two) times daily.    Dispense:  14 tablet    Refill:  0    Please let us know if you have any questions or concerns regarding this prescription.  I hope you are feeling better soon! Seek care promptly if your symptoms worsen, new concerns arise or you are not improving with treatment.  

## 2019-10-20 ENCOUNTER — Other Ambulatory Visit: Payer: Self-pay

## 2019-10-20 ENCOUNTER — Encounter: Payer: Self-pay | Admitting: Family Medicine

## 2019-10-20 ENCOUNTER — Ambulatory Visit (INDEPENDENT_AMBULATORY_CARE_PROVIDER_SITE_OTHER): Payer: 59 | Admitting: Family Medicine

## 2019-10-20 VITALS — BP 116/80 | HR 78 | Temp 98.1°F | Resp 16 | Ht 63.0 in | Wt 214.0 lb

## 2019-10-20 DIAGNOSIS — Z0000 Encounter for general adult medical examination without abnormal findings: Secondary | ICD-10-CM

## 2019-10-20 DIAGNOSIS — E669 Obesity, unspecified: Secondary | ICD-10-CM

## 2019-10-20 DIAGNOSIS — E559 Vitamin D deficiency, unspecified: Secondary | ICD-10-CM

## 2019-10-20 LAB — CBC WITH DIFFERENTIAL/PLATELET
Basophils Absolute: 0 10*3/uL (ref 0.0–0.1)
Basophils Relative: 0.9 % (ref 0.0–3.0)
Eosinophils Absolute: 0.3 10*3/uL (ref 0.0–0.7)
Eosinophils Relative: 6.2 % — ABNORMAL HIGH (ref 0.0–5.0)
HCT: 41.6 % (ref 36.0–46.0)
Hemoglobin: 13.9 g/dL (ref 12.0–15.0)
Lymphocytes Relative: 29.5 % (ref 12.0–46.0)
Lymphs Abs: 1.4 10*3/uL (ref 0.7–4.0)
MCHC: 33.4 g/dL (ref 30.0–36.0)
MCV: 88 fl (ref 78.0–100.0)
Monocytes Absolute: 0.4 10*3/uL (ref 0.1–1.0)
Monocytes Relative: 7.8 % (ref 3.0–12.0)
Neutro Abs: 2.6 10*3/uL (ref 1.4–7.7)
Neutrophils Relative %: 55.6 % (ref 43.0–77.0)
Platelets: 221 10*3/uL (ref 150.0–400.0)
RBC: 4.73 Mil/uL (ref 3.87–5.11)
RDW: 14 % (ref 11.5–15.5)
WBC: 4.6 10*3/uL (ref 4.0–10.5)

## 2019-10-20 LAB — HEPATIC FUNCTION PANEL
ALT: 16 U/L (ref 0–35)
AST: 15 U/L (ref 0–37)
Albumin: 4.3 g/dL (ref 3.5–5.2)
Alkaline Phosphatase: 45 U/L (ref 39–117)
Bilirubin, Direct: 0.2 mg/dL (ref 0.0–0.3)
Total Bilirubin: 1 mg/dL (ref 0.2–1.2)
Total Protein: 6.6 g/dL (ref 6.0–8.3)

## 2019-10-20 LAB — BASIC METABOLIC PANEL
BUN: 12 mg/dL (ref 6–23)
CO2: 25 mEq/L (ref 19–32)
Calcium: 9.1 mg/dL (ref 8.4–10.5)
Chloride: 106 mEq/L (ref 96–112)
Creatinine, Ser: 0.68 mg/dL (ref 0.40–1.20)
GFR: 93.52 mL/min (ref >60.00)
Glucose, Bld: 100 mg/dL — ABNORMAL HIGH (ref 70–99)
Potassium: 4.2 mEq/L (ref 3.5–5.1)
Sodium: 137 mEq/L (ref 135–145)

## 2019-10-20 LAB — LIPID PANEL
Cholesterol: 216 mg/dL — ABNORMAL HIGH (ref 0–200)
HDL: 54.1 mg/dL (ref >39.00)
LDL Cholesterol: 142 mg/dL — ABNORMAL HIGH (ref 0–99)
NonHDL: 161.78
Total CHOL/HDL Ratio: 4
Triglycerides: 100 mg/dL (ref 0.0–149.0)
VLDL: 20 mg/dL (ref 0.0–40.0)

## 2019-10-20 LAB — VITAMIN D 25 HYDROXY (VIT D DEFICIENCY, FRACTURES): VITD: 23.1 ng/mL — ABNORMAL LOW (ref 30.00–100.00)

## 2019-10-20 LAB — TSH: TSH: 1.52 u[IU]/mL (ref 0.35–4.50)

## 2019-10-20 NOTE — Patient Instructions (Addendum)
Follow up in 1 year or as needed We'll notify you of your lab results and make any changes if needed Continue to work on healthy diet and regular exercise- you're doing great! Call with any questions or concerns Enjoy the rest of your summer!!! 

## 2019-10-20 NOTE — Progress Notes (Signed)
   Subjective:    Patient ID: Tina Bautista, female    DOB: 05/31/1975, 44 y.o.   MRN: [id number]  HPI CPE- UTD on mammo, Tdap.  Pap scheduled in Sept.  Pt has not had COVID vaccine but does have antibodies.  Reviewed past medical, surgical, family and social histories.  Patient Care Team    Relationship Specialty Notifications Start End  Sheliah Hatch, MD PCP - General Family Medicine  01/23/16   Hyacinth Meeker, MD Referring Physician Obstetrics and Gynecology  10/20/19       Review of Systems Patient reports no vision/ hearing changes, adenopathy,fever, weight change,  persistant/recurrent hoarseness , swallowing issues, chest pain, palpitations, edema, persistant/recurrent cough, hemoptysis, dyspnea (rest/exertional/paroxysmal nocturnal), gastrointestinal bleeding (melena, rectal bleeding), abdominal pain, significant heartburn, bowel changes, GU symptoms (dysuria, hematuria, incontinence), Gyn symptoms (abnormal  bleeding, pain),  syncope, focal weakness, memory loss, numbness & tingling, skin/hair/nail changes, abnormal bruising or bleeding, anxiety, or depression.   This visit occurred during the SARS-CoV-2 public health emergency.  Safety protocols were in place, including screening questions prior to the visit, additional usage of staff PPE, and extensive cleaning of exam room while observing appropriate contact time as indicated for disinfecting solutions.       Objective:   Physical Exam General Appearance:    Alert, cooperative, no distress, appears stated age, obese  Head:    Normocephalic, without obvious abnormality, atraumatic  Eyes:    PERRL, conjunctiva/corneas clear, EOM's intact, fundi    benign, both eyes  Ears:    Normal TM's and external ear canals, both ears  Nose:   Deferred due to COVID  Throat:   Neck:   Supple, symmetrical, trachea midline, no adenopathy;    Thyroid: no enlargement/tenderness/nodules  Back:     Symmetric, no curvature, ROM normal, no  CVA tenderness  Lungs:     Clear to auscultation bilaterally, respirations unlabored  Chest Wall:    No tenderness or deformity   Heart:    Regular rate and rhythm, S1 and S2 normal, no murmur, rub   or gallop  Breast Exam:    Deferred to GYN  Abdomen:     Soft, non-tender, bowel sounds active all four quadrants,    no masses, no organomegaly  Genitalia:    Deferred to GYN  Rectal:    Extremities:   Extremities normal, atraumatic, no cyanosis or edema  Pulses:   2+ and symmetric all extremities  Skin:   Skin color, texture, turgor normal, no rashes or lesions  Lymph nodes:   Cervical, supraclavicular, and axillary nodes normal  Neurologic:   CNII-XII intact, normal strength, sensation and reflexes    throughout          Assessment & Plan:

## 2019-10-20 NOTE — Assessment & Plan Note (Signed)
Pt's PE WNL w/ exception of obesity.  UTD on mammo, Tdap.  Declines COVID vaccine at this time as she has antibodies.  Pap scheduled.  Anticipatory guidance provided.

## 2019-10-20 NOTE — Assessment & Plan Note (Signed)
Pt is down 5 lbs since last visit.  Applauded her efforts.  Check labs to risk stratify.  Will follow. 

## 2019-10-20 NOTE — Assessment & Plan Note (Signed)
Check labs and replete prn. 

## 2019-10-21 ENCOUNTER — Other Ambulatory Visit: Payer: Self-pay | Admitting: General Practice

## 2019-10-21 MED ORDER — VITAMIN D (ERGOCALCIFEROL) 1.25 MG (50000 UNIT) PO CAPS: 50000.0000 [IU] | ORAL_CAPSULE | ORAL | 0 refills | Status: DC

## 2019-10-21 MED FILL — VIT D2 1.25 MG (50,000 UNIT: 1.25 MG | 84 days supply | Qty: 12 | Fill #0

## 2019-11-22 ENCOUNTER — Other Ambulatory Visit (HOSPITAL_BASED_OUTPATIENT_CLINIC_OR_DEPARTMENT_OTHER): Payer: Self-pay | Admitting: Obstetrics and Gynecology

## 2019-11-22 DIAGNOSIS — Z1231 Encounter for screening mammogram for malignant neoplasm of breast: Secondary | ICD-10-CM

## 2019-12-22 ENCOUNTER — Other Ambulatory Visit: Payer: Self-pay

## 2019-12-22 ENCOUNTER — Ambulatory Visit (HOSPITAL_BASED_OUTPATIENT_CLINIC_OR_DEPARTMENT_OTHER)
Admission: RE | Admit: 2019-12-22 | Discharge: 2019-12-22 | Disposition: A | Discharge status: Home / Self Care | Payer: 59 | Source: Ambulatory Visit | Attending: Obstetrics and Gynecology | Admitting: Obstetrics and Gynecology

## 2019-12-22 DIAGNOSIS — Z1231 Encounter for screening mammogram for malignant neoplasm of breast: Secondary | ICD-10-CM | POA: Diagnosis not present

## 2020-02-20 DIAGNOSIS — Z01419 Encounter for gynecological examination (general) (routine) without abnormal findings: Secondary | ICD-10-CM | POA: Diagnosis not present

## 2020-03-30 ENCOUNTER — Other Ambulatory Visit: Payer: Self-pay | Admitting: Family Medicine

## 2020-04-02 ENCOUNTER — Other Ambulatory Visit: Payer: Self-pay | Admitting: Family Medicine

## 2020-04-02 MED FILL — MONTELUKAST SOD 10 MG TAB: 10 | 90 days supply | Qty: 90 | Fill #0

## 2020-04-13 IMAGING — MG MM DIGITAL SCREENING BILAT W/ TOMO W/ CAD
8 series · 8 of 24 positions shown · non-contrast
Comparison: Previous exam(s).

CLINICAL DATA: Screening.

EXAM:
DIGITAL SCREENING BILATERAL MAMMOGRAM WITH TOMO AND CAD

[L CC synth-2D]
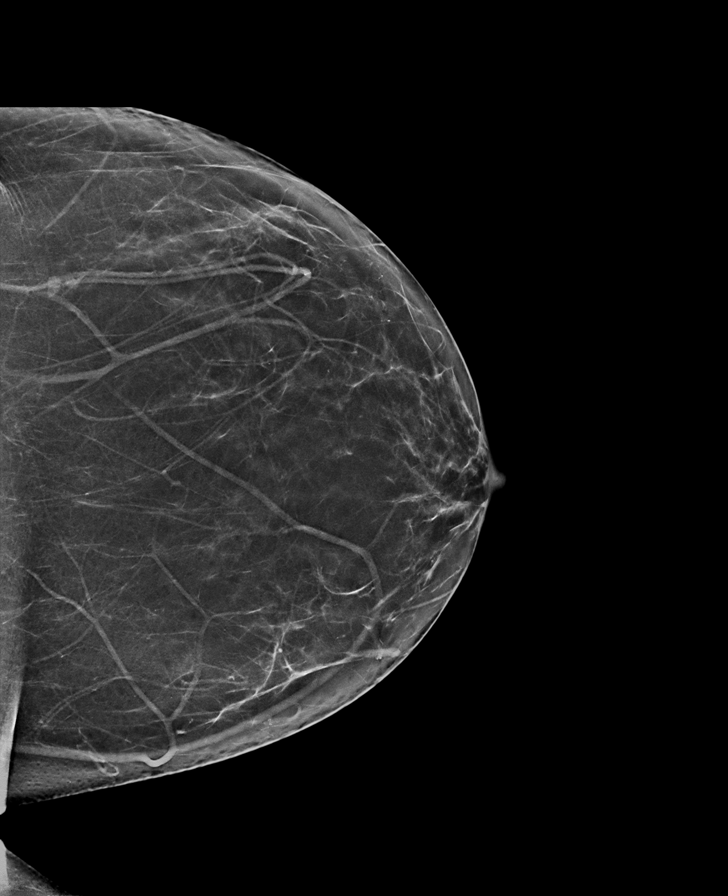

[R CC synth-2D]
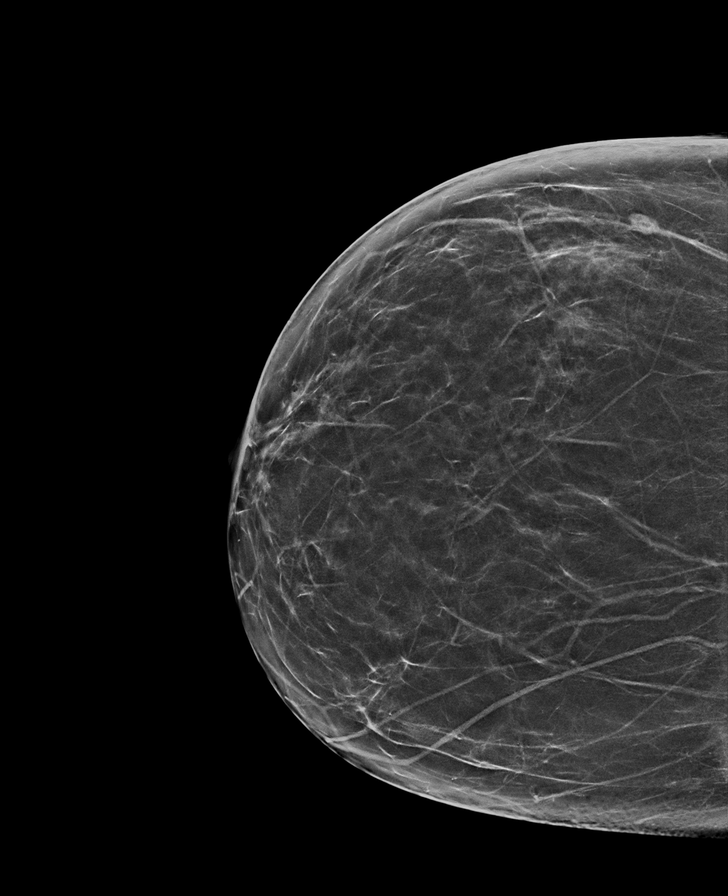

[L MLO synth-2D]
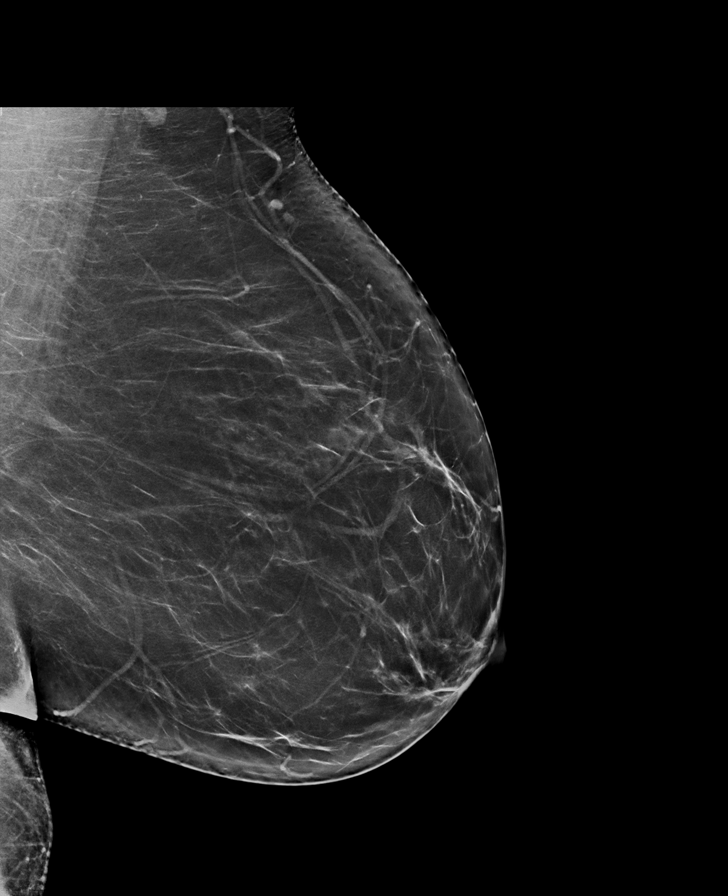

[R MLO synth-2D]
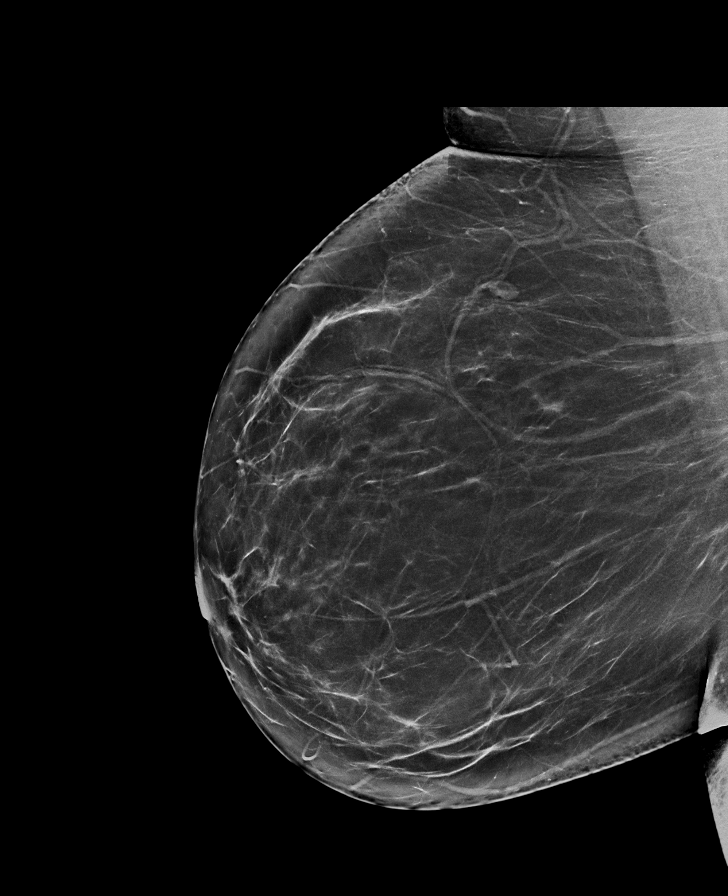

[R MLO tomo · tomo slice 42/83.0]
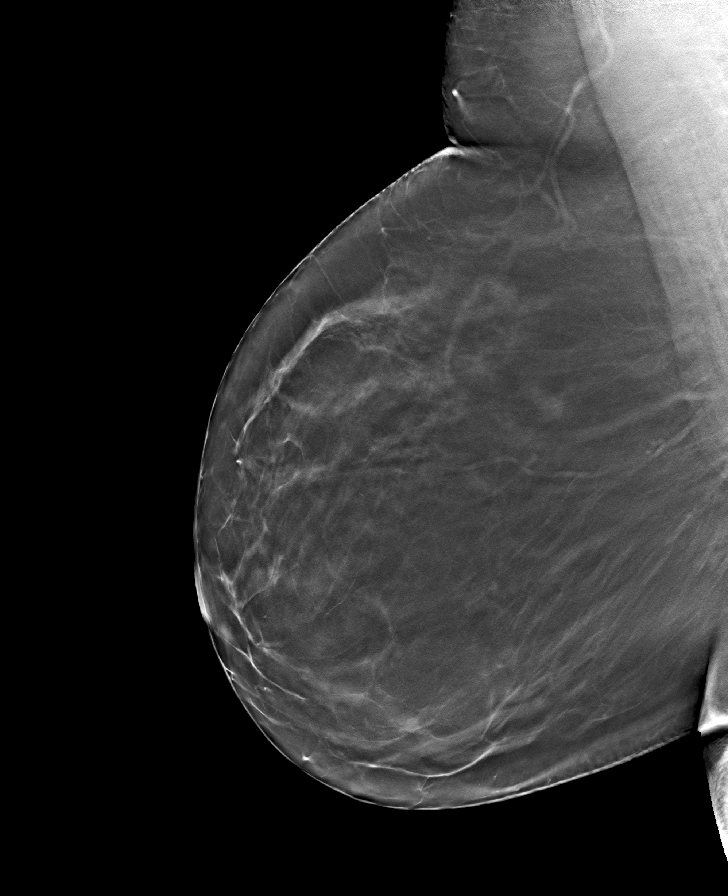

[L CC tomo · tomo slice 35/68.0]
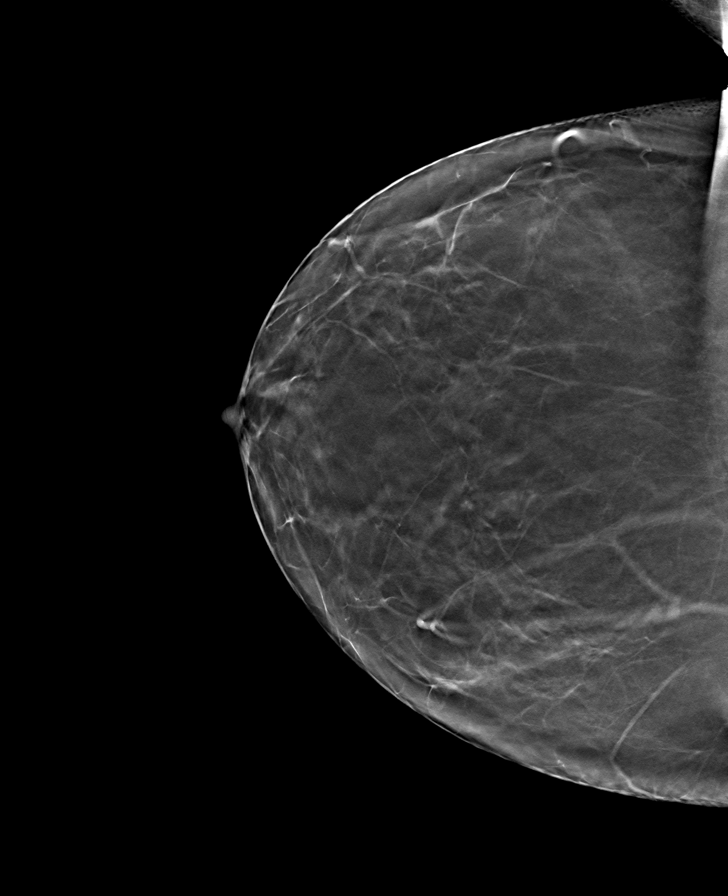

[L MLO tomo · tomo slice 40/79.0]
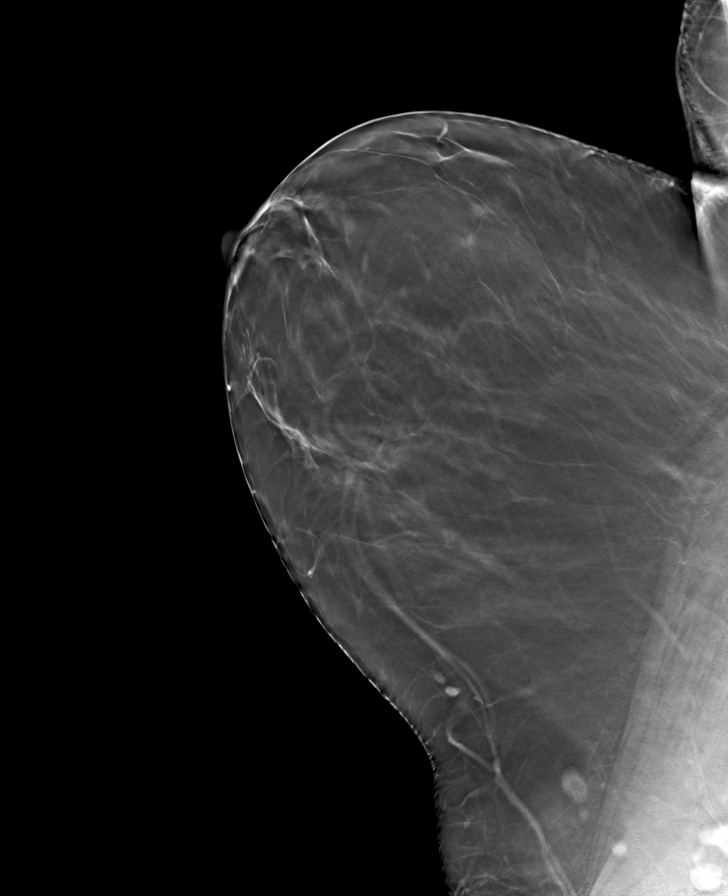

[R CC tomo · tomo slice 35/68.0]
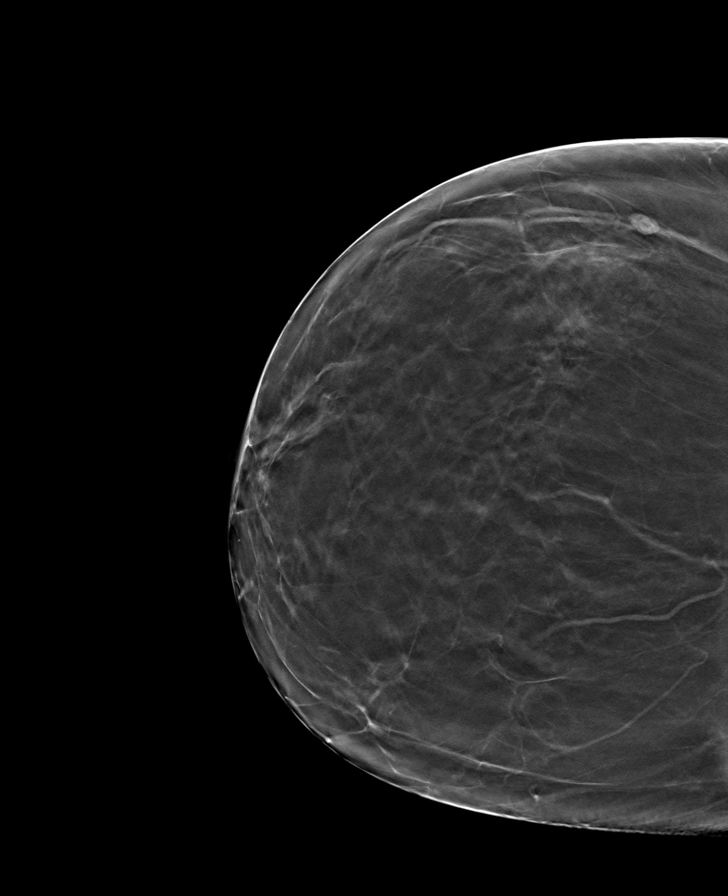

[8 of 24 positions shown; findings below may reference images not displayed]

ACR Breast Density Category b: There are scattered areas of
fibroglandular density.
FINDINGS: There are no findings suspicious for malignancy. Images were
processed with CAD.
IMPRESSION: No mammographic evidence of malignancy. A result letter of this
screening mammogram will be mailed directly to the patient.

RECOMMENDATION:
Screening mammogram in one year. (Code:[id number]-775)

BI-RADS CATEGORY  1: Negative.

## 2020-04-19 ENCOUNTER — Ambulatory Visit: Payer: 59 | Admitting: Family Medicine

## 2020-05-23 ENCOUNTER — Encounter: Payer: Self-pay | Admitting: Family Medicine

## 2020-05-23 DIAGNOSIS — F919 Conduct disorder, unspecified: Secondary | ICD-10-CM

## 2020-09-26 ENCOUNTER — Encounter: Payer: Self-pay | Admitting: *Deleted

## 2020-10-02 ENCOUNTER — Other Ambulatory Visit (HOSPITAL_BASED_OUTPATIENT_CLINIC_OR_DEPARTMENT_OTHER): Payer: Self-pay

## 2020-10-22 ENCOUNTER — Encounter: Payer: 59 | Admitting: Family Medicine

## 2020-10-29 ENCOUNTER — Encounter: Payer: 59 | Admitting: Family Medicine

## 2020-12-04 ENCOUNTER — Encounter: Payer: 59 | Admitting: Family Medicine

## 2021-01-30 ENCOUNTER — Encounter: Payer: 59 | Admitting: Family Medicine

## 2021-02-04 ENCOUNTER — Other Ambulatory Visit (HOSPITAL_BASED_OUTPATIENT_CLINIC_OR_DEPARTMENT_OTHER): Payer: Self-pay | Admitting: Family Medicine

## 2021-02-04 DIAGNOSIS — Z1231 Encounter for screening mammogram for malignant neoplasm of breast: Secondary | ICD-10-CM

## 2021-02-25 DIAGNOSIS — Z1211 Encounter for screening for malignant neoplasm of colon: Secondary | ICD-10-CM | POA: Diagnosis not present

## 2021-02-25 DIAGNOSIS — Z01419 Encounter for gynecological examination (general) (routine) without abnormal findings: Secondary | ICD-10-CM | POA: Diagnosis not present

## 2021-03-12 ENCOUNTER — Other Ambulatory Visit: Payer: Self-pay

## 2021-03-12 ENCOUNTER — Ambulatory Visit (HOSPITAL_BASED_OUTPATIENT_CLINIC_OR_DEPARTMENT_OTHER)
Admission: RE | Admit: 2021-03-12 | Discharge: 2021-03-12 | Disposition: A | Discharge status: Home / Self Care | Payer: 59 | Source: Ambulatory Visit | Attending: Family Medicine | Admitting: Family Medicine

## 2021-03-12 DIAGNOSIS — Z1231 Encounter for screening mammogram for malignant neoplasm of breast: Secondary | ICD-10-CM | POA: Insufficient documentation

## 2021-03-22 ENCOUNTER — Ambulatory Visit (INDEPENDENT_AMBULATORY_CARE_PROVIDER_SITE_OTHER): Payer: 59 | Admitting: Family Medicine

## 2021-03-22 ENCOUNTER — Encounter: Payer: Self-pay | Admitting: Family Medicine

## 2021-03-22 VITALS — BP 110/72 | HR 82 | Temp 97.5°F | Resp 16 | Ht 63.0 in | Wt 214.8 lb

## 2021-03-22 DIAGNOSIS — Z0000 Encounter for general adult medical examination without abnormal findings: Secondary | ICD-10-CM | POA: Diagnosis not present

## 2021-03-22 DIAGNOSIS — E669 Obesity, unspecified: Secondary | ICD-10-CM

## 2021-03-22 DIAGNOSIS — Z1159 Encounter for screening for other viral diseases: Secondary | ICD-10-CM

## 2021-03-22 DIAGNOSIS — Z1211 Encounter for screening for malignant neoplasm of colon: Secondary | ICD-10-CM

## 2021-03-22 DIAGNOSIS — E559 Vitamin D deficiency, unspecified: Secondary | ICD-10-CM

## 2021-03-22 DIAGNOSIS — Z114 Encounter for screening for human immunodeficiency virus [HIV]: Secondary | ICD-10-CM | POA: Diagnosis not present

## 2021-03-22 LAB — HEPATIC FUNCTION PANEL
ALT: 16 U/L (ref 0–35)
AST: 16 U/L (ref 0–37)
Albumin: 4.3 g/dL (ref 3.5–5.2)
Alkaline Phosphatase: 48 U/L (ref 39–117)
Bilirubin, Direct: 0.2 mg/dL (ref 0.0–0.3)
Total Bilirubin: 1.2 mg/dL (ref 0.2–1.2)
Total Protein: 6.9 g/dL (ref 6.0–8.3)

## 2021-03-22 LAB — CBC WITH DIFFERENTIAL/PLATELET
Basophils Absolute: 0 10*3/uL (ref 0.0–0.1)
Basophils Relative: 0.7 % (ref 0.0–3.0)
Eosinophils Absolute: 0.3 10*3/uL (ref 0.0–0.7)
Eosinophils Relative: 5.1 % — ABNORMAL HIGH (ref 0.0–5.0)
HCT: 41.7 % (ref 36.0–46.0)
Hemoglobin: 13.9 g/dL (ref 12.0–15.0)
Lymphocytes Relative: 27 % (ref 12.0–46.0)
Lymphs Abs: 1.3 10*3/uL (ref 0.7–4.0)
MCHC: 33.4 g/dL (ref 30.0–36.0)
MCV: 86.2 fl (ref 78.0–100.0)
Monocytes Absolute: 0.4 10*3/uL (ref 0.1–1.0)
Monocytes Relative: 7.5 % (ref 3.0–12.0)
Neutro Abs: 3 10*3/uL (ref 1.4–7.7)
Neutrophils Relative %: 59.7 % (ref 43.0–77.0)
Platelets: 206 10*3/uL (ref 150.0–400.0)
RBC: 4.84 Mil/uL (ref 3.87–5.11)
RDW: 13.3 % (ref 11.5–15.5)
WBC: 5 10*3/uL (ref 4.0–10.5)

## 2021-03-22 LAB — LIPID PANEL
Cholesterol: 237 mg/dL — ABNORMAL HIGH (ref 0–200)
HDL: 62.3 mg/dL (ref >39.00)
LDL Cholesterol: 156 mg/dL — ABNORMAL HIGH (ref 0–99)
NonHDL: 174.7
Total CHOL/HDL Ratio: 4
Triglycerides: 93 mg/dL (ref 0.0–149.0)
VLDL: 18.6 mg/dL (ref 0.0–40.0)

## 2021-03-22 LAB — BASIC METABOLIC PANEL
BUN: 11 mg/dL (ref 6–23)
CO2: 28 mEq/L (ref 19–32)
Calcium: 9.4 mg/dL (ref 8.4–10.5)
Chloride: 104 mEq/L (ref 96–112)
Creatinine, Ser: 0.74 mg/dL (ref 0.40–1.20)
GFR: 96.95 mL/min (ref >60.00)
Glucose, Bld: 86 mg/dL (ref 70–99)
Potassium: 4.3 mEq/L (ref 3.5–5.1)
Sodium: 140 mEq/L (ref 135–145)

## 2021-03-22 LAB — VITAMIN D 25 HYDROXY (VIT D DEFICIENCY, FRACTURES): VITD: 26.67 ng/mL — ABNORMAL LOW (ref 30.00–100.00)

## 2021-03-22 LAB — TSH: TSH: 1.94 u[IU]/mL (ref 0.35–5.50)

## 2021-03-22 NOTE — Patient Instructions (Addendum)
Follow up in 1 year or as needed We'll notify you of your lab results and make any changes if needed Continue to work on healthy diet and regular exercise- you can do it! We'll call you with your GI referral for the colonoscopy Call with any questions or concerns Stay Safe!  Stay Healthy! Happy Holidays!!

## 2021-03-22 NOTE — Assessment & Plan Note (Signed)
Ongoing issue for pt.  BMI 38.05  Stressed need for healthy diet and regular exercise.  Check labs to risk stratify.  Will follow.

## 2021-03-22 NOTE — Assessment & Plan Note (Signed)
Check labs and replete prn. 

## 2021-03-22 NOTE — Assessment & Plan Note (Signed)
Pt's PE WNL w/ exception of obesity.  UTD w/ GYN.  GI referral placed for colonoscopy.  Check labs.  Anticipatory guidance provided.

## 2021-03-22 NOTE — Progress Notes (Signed)
° °  Subjective:    Patient ID: Tina Bautista, female    DOB: [birth year], 45 y.o.   MRN: [id number]  HPI CPE- UTD on mammo.  Due for pap next year.  Due for colonoscopy.  UTD on Tdap.  Declines flu.  No concerns today  Patient Care Team    Relationship Specialty Notifications Start End  Sheliah Hatch, MD PCP - General Family Medicine  01/23/16   Hyacinth Meeker, MD Referring Physician Obstetrics and Gynecology  10/20/19     Health Maintenance  Topic Date Due   HIV Screening  Never done   Hepatitis C Screening  Never done   PAP SMEAR-Modifier  08/21/2019   COLONOSCOPY (Pts 45-67yrs Insurance coverage will need to be confirmed)  Never done   COVID-19 Vaccine (1) 04/07/2021 (Originally 03/14/1976)   INFLUENZA VACCINE  06/28/2021 (Originally 10/29/2020)   MAMMOGRAM  03/12/2022   TETANUS/TDAP  01/22/2025   Pneumococcal Vaccine 44-37 Years old  Aged Out   HPV VACCINES  Aged Out      Review of Systems Patient reports no vision/ hearing changes, adenopathy,fever, weight change,  persistant/recurrent hoarseness , swallowing issues, chest pain, palpitations, edema, persistant/recurrent cough, hemoptysis, dyspnea (rest/exertional/paroxysmal nocturnal), gastrointestinal bleeding (melena, rectal bleeding), abdominal pain, significant heartburn, bowel changes, GU symptoms (dysuria, hematuria, incontinence), Gyn symptoms (abnormal  bleeding, pain),  syncope, focal weakness, memory loss, numbness & tingling, skin/hair/nail changes, abnormal bruising or bleeding, anxiety, or depression.   This visit occurred during the SARS-CoV-2 public health emergency.  Safety protocols were in place, including screening questions prior to the visit, additional usage of staff PPE, and extensive cleaning of exam room while observing appropriate contact time as indicated for disinfecting solutions.      Objective:   Physical Exam General Appearance:    Alert, cooperative, no distress, appears stated age, obese   Head:    Normocephalic, without obvious abnormality, atraumatic  Eyes:    PERRL, conjunctiva/corneas clear, EOM's intact, fundi    benign, both eyes  Ears:    Normal TM's and external ear canals, both ears  Nose:   Deferred due to COVID  Throat:   Neck:   Supple, symmetrical, trachea midline, no adenopathy;    Thyroid: no enlargement/tenderness/nodules  Back:     Symmetric, no curvature, ROM normal, no CVA tenderness  Lungs:     Clear to auscultation bilaterally, respirations unlabored  Chest Wall:    No tenderness or deformity   Heart:    Regular rate and rhythm, S1 and S2 normal, no murmur, rub   or gallop  Breast Exam:    Deferred to GYN  Abdomen:     Soft, non-tender, bowel sounds active all four quadrants,    no masses, no organomegaly  Genitalia:    Deferred to GYN  Rectal:    Extremities:   Extremities normal, atraumatic, no cyanosis or edema  Pulses:   2+ and symmetric all extremities  Skin:   Skin color, texture, turgor normal, no rashes or lesions  Lymph nodes:   Cervical, supraclavicular, and axillary nodes normal  Neurologic:   CNII-XII intact, normal strength, sensation and reflexes    throughout          Assessment & Plan:

## 2021-03-26 LAB — HEPATITIS C ANTIBODY
Hepatitis C Ab: NONREACTIVE
SIGNAL TO CUT-OFF: 0.04 (ref <1.00)

## 2021-03-26 LAB — HIV ANTIBODY (ROUTINE TESTING W REFLEX): HIV 1&2 Ab, 4th Generation: NONREACTIVE

## 2021-03-27 ENCOUNTER — Telehealth: Payer: Self-pay

## 2021-03-27 ENCOUNTER — Other Ambulatory Visit (HOSPITAL_BASED_OUTPATIENT_CLINIC_OR_DEPARTMENT_OTHER): Payer: Self-pay

## 2021-03-27 MED ORDER — VITAMIN D (ERGOCALCIFEROL) 1.25 MG (50000 UNIT) PO CAPS
50000.0000 [IU] | ORAL_CAPSULE | ORAL | 0 refills | Status: DC
  Filled 2021-03-27 – 2021-04-29 (×2): qty 12, 84d supply, fill #0

## 2021-03-27 NOTE — Telephone Encounter (Signed)
Spoke to husband, he stated he would get pt to call back

## 2021-03-27 NOTE — Telephone Encounter (Signed)
Patient returned call and is aware of labs. I will send in vitamin d. Pt aware these results are on her my chart so she can log in to see which OTC meds she needs if she forgets.

## 2021-03-27 NOTE — Telephone Encounter (Signed)
-----   Message from Sheliah Hatch, MD sent at 03/26/2021  7:32 AM EST ----- Vit D is low.  Based on this, we need to start prescription 50,000 units weekly x12 weeks in addition to daily OTC supplement of at least 2000 units.   Also, total cholesterol and LDL (bad cholesterol) are both higher than last check.  Please continue to work on healthy diet and regular exercise and in the mean time, please add daily OTC Red Yeast Rice supplement to improve these numbers.  Remainder of labs look good

## 2021-04-10 ENCOUNTER — Other Ambulatory Visit (HOSPITAL_BASED_OUTPATIENT_CLINIC_OR_DEPARTMENT_OTHER): Payer: Self-pay

## 2021-04-29 ENCOUNTER — Encounter (HOSPITAL_BASED_OUTPATIENT_CLINIC_OR_DEPARTMENT_OTHER): Payer: Self-pay

## 2021-04-29 ENCOUNTER — Other Ambulatory Visit (HOSPITAL_BASED_OUTPATIENT_CLINIC_OR_DEPARTMENT_OTHER): Payer: Self-pay

## 2021-04-29 DIAGNOSIS — L718 Other rosacea: Secondary | ICD-10-CM | POA: Diagnosis not present

## 2021-04-29 MED ORDER — METRONIDAZOLE 0.75 % EX LOTN
TOPICAL_LOTION | CUTANEOUS | 3 refills | Status: AC
  Filled 2021-04-29: qty 59, 14d supply, fill #0

## 2021-04-29 MED ORDER — DOXYCYCLINE MONOHYDRATE 50 MG PO TABS
ORAL_TABLET | ORAL | 3 refills | Status: DC
  Filled 2021-04-29: qty 60, 30d supply, fill #0
  Filled 2021-07-22: qty 60, 30d supply, fill #1

## 2021-04-29 MED ORDER — METRONIDAZOLE 0.75 % EX CREA
TOPICAL_CREAM | CUTANEOUS | 3 refills | Status: DC
  Filled 2021-04-29: qty 45, 30d supply, fill #0

## 2021-04-30 ENCOUNTER — Other Ambulatory Visit (HOSPITAL_BASED_OUTPATIENT_CLINIC_OR_DEPARTMENT_OTHER): Payer: Self-pay

## 2021-07-22 ENCOUNTER — Other Ambulatory Visit (HOSPITAL_BASED_OUTPATIENT_CLINIC_OR_DEPARTMENT_OTHER): Payer: Self-pay

## 2021-10-23 ENCOUNTER — Other Ambulatory Visit (HOSPITAL_BASED_OUTPATIENT_CLINIC_OR_DEPARTMENT_OTHER): Payer: Self-pay

## 2022-03-27 ENCOUNTER — Encounter: Payer: 59 | Admitting: Family Medicine

## 2022-04-21 ENCOUNTER — Other Ambulatory Visit (HOSPITAL_BASED_OUTPATIENT_CLINIC_OR_DEPARTMENT_OTHER): Payer: Self-pay | Admitting: Family Medicine

## 2022-04-21 DIAGNOSIS — Z1231 Encounter for screening mammogram for malignant neoplasm of breast: Secondary | ICD-10-CM

## 2022-04-24 LAB — HM PAP SMEAR

## 2022-04-28 ENCOUNTER — Encounter (HOSPITAL_BASED_OUTPATIENT_CLINIC_OR_DEPARTMENT_OTHER): Payer: Self-pay

## 2022-04-28 ENCOUNTER — Ambulatory Visit (HOSPITAL_BASED_OUTPATIENT_CLINIC_OR_DEPARTMENT_OTHER)
Admission: RE | Admit: 2022-04-28 | Discharge: 2022-04-28 | Disposition: A | Discharge status: Home / Self Care | Payer: 59 | Source: Ambulatory Visit | Attending: Family Medicine | Admitting: Family Medicine

## 2022-04-28 DIAGNOSIS — Z1231 Encounter for screening mammogram for malignant neoplasm of breast: Secondary | ICD-10-CM | POA: Insufficient documentation

## 2022-05-23 ENCOUNTER — Ambulatory Visit (INDEPENDENT_AMBULATORY_CARE_PROVIDER_SITE_OTHER): Payer: 59 | Admitting: Family Medicine

## 2022-05-23 ENCOUNTER — Other Ambulatory Visit (HOSPITAL_BASED_OUTPATIENT_CLINIC_OR_DEPARTMENT_OTHER): Payer: Self-pay

## 2022-05-23 ENCOUNTER — Encounter: Payer: Self-pay | Admitting: Family Medicine

## 2022-05-23 VITALS — BP 128/84 | HR 96 | Temp 98.2°F | Resp 18 | Ht 63.0 in | Wt 215.2 lb

## 2022-05-23 DIAGNOSIS — Z0000 Encounter for general adult medical examination without abnormal findings: Secondary | ICD-10-CM

## 2022-05-23 DIAGNOSIS — E559 Vitamin D deficiency, unspecified: Secondary | ICD-10-CM | POA: Diagnosis not present

## 2022-05-23 DIAGNOSIS — E669 Obesity, unspecified: Secondary | ICD-10-CM | POA: Diagnosis not present

## 2022-05-23 DIAGNOSIS — Z1211 Encounter for screening for malignant neoplasm of colon: Secondary | ICD-10-CM

## 2022-05-23 LAB — CBC WITH DIFFERENTIAL/PLATELET
Basophils Absolute: 0 10*3/uL (ref 0.0–0.1)
Basophils Relative: 0.6 % (ref 0.0–3.0)
Eosinophils Absolute: 0.2 10*3/uL (ref 0.0–0.7)
Eosinophils Relative: 2.9 % (ref 0.0–5.0)
HCT: 42.3 % (ref 36.0–46.0)
Hemoglobin: 14.1 g/dL (ref 12.0–15.0)
Lymphocytes Relative: 26.4 % (ref 12.0–46.0)
Lymphs Abs: 1.8 10*3/uL (ref 0.7–4.0)
MCHC: 33.3 g/dL (ref 30.0–36.0)
MCV: 85.2 fl (ref 78.0–100.0)
Monocytes Absolute: 0.5 10*3/uL (ref 0.1–1.0)
Monocytes Relative: 6.6 % (ref 3.0–12.0)
Neutro Abs: 4.4 10*3/uL (ref 1.4–7.7)
Neutrophils Relative %: 63.5 % (ref 43.0–77.0)
Platelets: 229 10*3/uL (ref 150.0–400.0)
RBC: 4.96 Mil/uL (ref 3.87–5.11)
RDW: 15.1 % (ref 11.5–15.5)
WBC: 6.9 10*3/uL (ref 4.0–10.5)

## 2022-05-23 LAB — HEPATIC FUNCTION PANEL
ALT: 18 U/L (ref 0–35)
AST: 18 U/L (ref 0–37)
Albumin: 4.4 g/dL (ref 3.5–5.2)
Alkaline Phosphatase: 51 U/L (ref 39–117)
Bilirubin, Direct: 0.2 mg/dL (ref 0.0–0.3)
Total Bilirubin: 1.1 mg/dL (ref 0.2–1.2)
Total Protein: 6.9 g/dL (ref 6.0–8.3)

## 2022-05-23 LAB — BASIC METABOLIC PANEL
BUN: 9 mg/dL (ref 6–23)
CO2: 28 mEq/L (ref 19–32)
Calcium: 9.8 mg/dL (ref 8.4–10.5)
Chloride: 104 mEq/L (ref 96–112)
Creatinine, Ser: 0.71 mg/dL (ref 0.40–1.20)
GFR: 101.06 mL/min (ref >60.00)
Glucose, Bld: 80 mg/dL (ref 70–99)
Potassium: 4.2 mEq/L (ref 3.5–5.1)
Sodium: 141 mEq/L (ref 135–145)

## 2022-05-23 LAB — VITAMIN D 25 HYDROXY (VIT D DEFICIENCY, FRACTURES): VITD: 20.42 ng/mL — ABNORMAL LOW (ref 30.00–100.00)

## 2022-05-23 LAB — LIPID PANEL
Cholesterol: 234 mg/dL — ABNORMAL HIGH (ref 0–200)
HDL: 60.7 mg/dL (ref >39.00)
LDL Cholesterol: 154 mg/dL — ABNORMAL HIGH (ref 0–99)
NonHDL: 172.87
Total CHOL/HDL Ratio: 4
Triglycerides: 94 mg/dL (ref 0.0–149.0)
VLDL: 18.8 mg/dL (ref 0.0–40.0)

## 2022-05-23 LAB — TSH: TSH: 1.5 u[IU]/mL (ref 0.35–5.50)

## 2022-05-23 LAB — HEMOGLOBIN A1C: Hgb A1c MFr Bld: 5.4 % (ref 4.6–6.5)

## 2022-05-23 MED ORDER — BUPROPION HCL 75 MG PO TABS
75.0000 mg | ORAL_TABLET | Freq: Two times a day (BID) | ORAL | 3 refills | Status: DC
  Filled 2022-05-23: qty 60, 30d supply, fill #0
  Filled 2022-06-18: qty 60, 30d supply, fill #1

## 2022-05-23 MED ORDER — MONTELUKAST SODIUM 10 MG PO TABS
10.0000 mg | ORAL_TABLET | Freq: Every day | ORAL | 3 refills | Status: DC
  Filled 2022-05-23: qty 90, 90d supply, fill #0
  Filled 2022-12-22: qty 90, 90d supply, fill #1
  Filled 2023-05-12: qty 90, 90d supply, fill #2

## 2022-05-23 NOTE — Assessment & Plan Note (Signed)
Check labs and replete prn. 

## 2022-05-23 NOTE — Patient Instructions (Addendum)
Follow up in 6 weeks to recheck mood We'll notify you of your lab results and make any changes if needed Complete and return the cologuard as directed START the Wellbutrin twice daily to help w/ anxiety RESTART the Singulair Call with any questions or concerns Stay Safe!  Stay Healthy!

## 2022-05-23 NOTE — Assessment & Plan Note (Signed)
Weight and BMI are stable.  Encouraged low carb diet and regular exercise.  Check labs to risk stratify.  Will follow.

## 2022-05-23 NOTE — Assessment & Plan Note (Signed)
Pt's PE WNL w/ exception of BMI.  UTD on pap, mammo.  Elected to do cologuard over colonoscopy.  UTD on Tdap.  Declines flu.  Check labs.  Anticipatory guidance provided.

## 2022-05-23 NOTE — Progress Notes (Signed)
   Subjective:    Patient ID: Tina Bautista, female    DOB: 08-Dec-1975, 47 y.o.   MRN: [id number]  HPI CPE- UTD on mammo, pap, Tdap.  Due for colon cancer screen.  Patient Care Team    Relationship Specialty Notifications Start End  Midge Minium, MD PCP - General Family Medicine  01/23/16   Carlis Abbott, MD Referring Physician Obstetrics and Gynecology  10/20/19      Health Maintenance  Topic Date Due   COLONOSCOPY (Pts 45-81yr Insurance coverage will need to be confirmed)  Never done   INFLUENZA VACCINE  06/29/2022 (Originally 10/29/2021)   MAMMOGRAM  04/29/2023   DTaP/Tdap/Td (2 - Td or Tdap) 01/22/2025   PAP SMEAR-Modifier  04/24/2025   Hepatitis C Screening  Completed   HIV Screening  Completed   HPV VACCINES  Aged Out   COVID-19 Vaccine  Discontinued      Review of Systems Patient reports no vision/ hearing changes, adenopathy,fever, weight change,  persistant/recurrent hoarseness , swallowing issues, chest pain, palpitations, edema, persistant/recurrent cough, hemoptysis, dyspnea (rest/exertional/paroxysmal nocturnal), gastrointestinal bleeding (melena, rectal bleeding), abdominal pain, significant heartburn, bowel changes, GU symptoms (dysuria, hematuria, incontinence), Gyn symptoms (abnormal  bleeding, pain),  syncope, focal weakness, memory loss, numbness & tingling, skin/hair/nail changes, abnormal bruising or bleeding.   + worsening anxiety- pt is interested in restarting medication    Objective:   Physical Exam General Appearance:    Alert, cooperative, no distress, appears stated age, obese  Head:    Normocephalic, without obvious abnormality, atraumatic  Eyes:    PERRL, conjunctiva/corneas clear, EOM's intact both eyes  Ears:    Normal TM's and external ear canals, both ears  Nose:   Nares normal, septum midline, mucosa normal, no drainage    or sinus tenderness  Throat:   Lips, mucosa, and tongue normal; teeth and gums normal  Neck:   Supple,  symmetrical, trachea midline, no adenopathy;    Thyroid: no enlargement/tenderness/nodules  Back:     Symmetric, no curvature, ROM normal, no CVA tenderness  Lungs:     Clear to auscultation bilaterally, respirations unlabored  Chest Wall:    No tenderness or deformity   Heart:    Regular rate and rhythm, S1 and S2 normal, no murmur, rub   or gallop  Breast Exam:    Deferred to GYN  Abdomen:     Soft, non-tender, bowel sounds active all four quadrants,    no masses, no organomegaly  Genitalia:    Deferred to GYN  Rectal:    Extremities:   Extremities normal, atraumatic, no cyanosis or edema  Pulses:   2+ and symmetric all extremities  Skin:   Skin color, texture, turgor normal, no rashes or lesions  Lymph nodes:   Cervical, supraclavicular, and axillary nodes normal  Neurologic:   CNII-XII intact, normal strength, sensation and reflexes    throughout          Assessment & Plan:

## 2022-05-26 ENCOUNTER — Telehealth: Payer: Self-pay

## 2022-05-26 ENCOUNTER — Other Ambulatory Visit (HOSPITAL_BASED_OUTPATIENT_CLINIC_OR_DEPARTMENT_OTHER): Payer: Self-pay

## 2022-05-26 ENCOUNTER — Other Ambulatory Visit: Payer: Self-pay

## 2022-05-26 DIAGNOSIS — E559 Vitamin D deficiency, unspecified: Secondary | ICD-10-CM

## 2022-05-26 MED ORDER — VITAMIN D (ERGOCALCIFEROL) 1.25 MG (50000 UNIT) PO CAPS
50000.0000 [IU] | ORAL_CAPSULE | ORAL | 12 refills | Status: AC
  Filled 2022-05-26: qty 7, 49d supply, fill #0

## 2022-05-26 NOTE — Telephone Encounter (Signed)
-----   Message from Midge Minium, MD sent at 05/24/2022  4:31 PM EST ----- Labs look great w/ exception of  1) low Vit D.  Based on this, we need to start 50,000 units weekly x12 weeks in addition to daily OTC supplement of at least 2000 units.   2) total cholesterol and LDL (bad cholesterol) remain elevated.  This will improve w/ healthy diet and regular exercise.  Thankfully ratio of good to bad is still good so no meds needed at this time but we will follow closely

## 2022-05-26 NOTE — Telephone Encounter (Signed)
Left lab results on pt VM vitamin d 50,000 units has been sent in

## 2022-06-02 ENCOUNTER — Other Ambulatory Visit (HOSPITAL_BASED_OUTPATIENT_CLINIC_OR_DEPARTMENT_OTHER): Payer: Self-pay

## 2022-06-18 ENCOUNTER — Other Ambulatory Visit (HOSPITAL_BASED_OUTPATIENT_CLINIC_OR_DEPARTMENT_OTHER): Payer: Self-pay

## 2022-07-04 ENCOUNTER — Ambulatory Visit: Payer: 59 | Admitting: Family Medicine

## 2022-07-18 ENCOUNTER — Other Ambulatory Visit (HOSPITAL_BASED_OUTPATIENT_CLINIC_OR_DEPARTMENT_OTHER): Payer: Self-pay

## 2022-07-18 ENCOUNTER — Ambulatory Visit: Payer: 59 | Admitting: Family Medicine

## 2022-07-18 ENCOUNTER — Encounter: Payer: Self-pay | Admitting: Family Medicine

## 2022-07-18 VITALS — BP 126/78 | HR 77 | Temp 97.9°F | Resp 98 | Ht 63.0 in | Wt 215.4 lb

## 2022-07-18 DIAGNOSIS — F419 Anxiety disorder, unspecified: Secondary | ICD-10-CM | POA: Diagnosis not present

## 2022-07-18 MED ORDER — BUPROPION HCL ER (XL) 150 MG PO TB24
150.0000 mg | ORAL_TABLET | Freq: Every day | ORAL | 3 refills | Status: DC
  Filled 2022-07-18: qty 90, 90d supply, fill #0
  Filled 2022-11-01: qty 90, 90d supply, fill #1
  Filled 2023-02-11 (×2): qty 90, 90d supply, fill #2
  Filled 2023-05-12: qty 90, 90d supply, fill #3

## 2022-07-18 NOTE — Progress Notes (Signed)
   Subjective:    Patient ID: Tina Bautista, female    DOB: 11-25-1975, 47 y.o.   MRN: [id number]  HPI Anxiety- was started on Wellbutrin  BID at last visit.  Pt reports feeling 'great'.  Anxiety is improving.  Able to handle work, Public affairs consultant.  Pt is interested in increasing dose to extended release so that she only needs to take medication once daily.   Review of Systems For ROS see HPI     Objective:   Physical Exam Vitals reviewed.  Constitutional:      General: She is not in acute distress.    Appearance: Normal appearance. She is not ill-appearing.  HENT:     Head: Normocephalic and atraumatic.  Skin:    General: Skin is warm and dry.  Neurological:     General: No focal deficit present.     Mental Status: She is alert and oriented to person, place, and time.  Psychiatric:        Mood and Affect: Mood normal.        Behavior: Behavior normal.        Thought Content: Thought content normal.           Assessment & Plan:

## 2022-07-18 NOTE — Patient Instructions (Addendum)
Schedule your physical for February Increase the Wellbutrin to  daily If you don't like the higher dose- let me know! Call with any questions or concerns Happy Spring!!!

## 2022-07-18 NOTE — Assessment & Plan Note (Signed)
Improved.  Pt reports sxs are better on the medication and she is not having any adverse effects.  Is interested in increasing dose so that she only needs to take 1 tab daily.  New prescription sent.  She will let me know how this works for her

## 2022-07-24 ENCOUNTER — Other Ambulatory Visit (HOSPITAL_BASED_OUTPATIENT_CLINIC_OR_DEPARTMENT_OTHER): Payer: Self-pay

## 2022-11-08 ENCOUNTER — Other Ambulatory Visit (HOSPITAL_BASED_OUTPATIENT_CLINIC_OR_DEPARTMENT_OTHER): Payer: Self-pay

## 2022-11-19 ENCOUNTER — Other Ambulatory Visit: Payer: Self-pay | Admitting: Family Medicine

## 2022-11-19 ENCOUNTER — Other Ambulatory Visit (HOSPITAL_BASED_OUTPATIENT_CLINIC_OR_DEPARTMENT_OTHER): Payer: Self-pay

## 2022-11-19 ENCOUNTER — Other Ambulatory Visit (HOSPITAL_COMMUNITY): Payer: Self-pay

## 2022-11-19 MED ORDER — DOXYCYCLINE MONOHYDRATE 50 MG PO TABS
50.0000 mg | ORAL_TABLET | Freq: Two times a day (BID) | ORAL | 3 refills | Status: DC
  Filled 2022-11-19: qty 60, 30d supply, fill #0
  Filled 2023-05-12: qty 60, 30d supply, fill #1

## 2022-11-19 NOTE — Telephone Encounter (Signed)
Do you want the pt to continue taking this ?

## 2022-11-20 ENCOUNTER — Other Ambulatory Visit (HOSPITAL_COMMUNITY): Payer: Self-pay

## 2022-11-21 ENCOUNTER — Other Ambulatory Visit (HOSPITAL_COMMUNITY): Payer: Self-pay

## 2022-11-25 ENCOUNTER — Other Ambulatory Visit (HOSPITAL_COMMUNITY): Payer: Self-pay

## 2023-02-11 ENCOUNTER — Other Ambulatory Visit (HOSPITAL_BASED_OUTPATIENT_CLINIC_OR_DEPARTMENT_OTHER): Payer: Self-pay

## 2023-02-12 ENCOUNTER — Other Ambulatory Visit: Payer: Self-pay

## 2023-04-22 ENCOUNTER — Other Ambulatory Visit (HOSPITAL_BASED_OUTPATIENT_CLINIC_OR_DEPARTMENT_OTHER): Payer: Self-pay | Admitting: Obstetrics and Gynecology

## 2023-04-22 DIAGNOSIS — Z139 Encounter for screening, unspecified: Secondary | ICD-10-CM

## 2023-05-04 ENCOUNTER — Ambulatory Visit (HOSPITAL_BASED_OUTPATIENT_CLINIC_OR_DEPARTMENT_OTHER)
Admission: RE | Admit: 2023-05-04 | Discharge: 2023-05-04 | Disposition: A | Discharge status: Home / Self Care | Payer: 59 | Source: Ambulatory Visit | Attending: Obstetrics and Gynecology | Admitting: Obstetrics and Gynecology

## 2023-05-04 ENCOUNTER — Encounter (HOSPITAL_BASED_OUTPATIENT_CLINIC_OR_DEPARTMENT_OTHER): Payer: Self-pay

## 2023-05-04 DIAGNOSIS — Z1231 Encounter for screening mammogram for malignant neoplasm of breast: Secondary | ICD-10-CM | POA: Insufficient documentation

## 2023-05-04 DIAGNOSIS — Z139 Encounter for screening, unspecified: Secondary | ICD-10-CM | POA: Diagnosis present

## 2023-05-12 ENCOUNTER — Other Ambulatory Visit (HOSPITAL_BASED_OUTPATIENT_CLINIC_OR_DEPARTMENT_OTHER): Payer: Self-pay

## 2023-05-28 ENCOUNTER — Other Ambulatory Visit (HOSPITAL_BASED_OUTPATIENT_CLINIC_OR_DEPARTMENT_OTHER): Payer: Self-pay

## 2023-05-28 ENCOUNTER — Ambulatory Visit (INDEPENDENT_AMBULATORY_CARE_PROVIDER_SITE_OTHER): Payer: 59 | Admitting: Family Medicine

## 2023-05-28 ENCOUNTER — Encounter: Payer: Self-pay | Admitting: Family Medicine

## 2023-05-28 VITALS — BP 118/72 | HR 88 | Temp 98.6°F | Ht 63.0 in | Wt 221.1 lb

## 2023-05-28 DIAGNOSIS — E669 Obesity, unspecified: Secondary | ICD-10-CM

## 2023-05-28 DIAGNOSIS — E559 Vitamin D deficiency, unspecified: Secondary | ICD-10-CM | POA: Diagnosis not present

## 2023-05-28 DIAGNOSIS — Z0000 Encounter for general adult medical examination without abnormal findings: Secondary | ICD-10-CM | POA: Diagnosis not present

## 2023-05-28 LAB — HEPATIC FUNCTION PANEL
ALT: 29 U/L (ref 0–35)
AST: 20 U/L (ref 0–37)
Albumin: 4.4 g/dL (ref 3.5–5.2)
Alkaline Phosphatase: 53 U/L (ref 39–117)
Bilirubin, Direct: 0.2 mg/dL (ref 0.0–0.3)
Total Bilirubin: 1.3 mg/dL — ABNORMAL HIGH (ref 0.2–1.2)
Total Protein: 7.3 g/dL (ref 6.0–8.3)

## 2023-05-28 LAB — LIPID PANEL
Cholesterol: 244 mg/dL — ABNORMAL HIGH (ref 0–200)
HDL: 62.4 mg/dL (ref >39.00)
LDL Cholesterol: 158 mg/dL — ABNORMAL HIGH (ref 0–99)
NonHDL: 181.15
Total CHOL/HDL Ratio: 4
Triglycerides: 115 mg/dL (ref 0.0–149.0)
VLDL: 23 mg/dL (ref 0.0–40.0)

## 2023-05-28 LAB — CBC WITH DIFFERENTIAL/PLATELET
Basophils Absolute: 0 10*3/uL (ref 0.0–0.1)
Basophils Relative: 0.7 % (ref 0.0–3.0)
Eosinophils Absolute: 0.1 10*3/uL (ref 0.0–0.7)
Eosinophils Relative: 2.8 % (ref 0.0–5.0)
HCT: 44.1 % (ref 36.0–46.0)
Hemoglobin: 14.8 g/dL (ref 12.0–15.0)
Lymphocytes Relative: 27.5 % (ref 12.0–46.0)
Lymphs Abs: 1.5 10*3/uL (ref 0.7–4.0)
MCHC: 33.7 g/dL (ref 30.0–36.0)
MCV: 87.6 fL (ref 78.0–100.0)
Monocytes Absolute: 0.4 10*3/uL (ref 0.1–1.0)
Monocytes Relative: 7.7 % (ref 3.0–12.0)
Neutro Abs: 3.3 10*3/uL (ref 1.4–7.7)
Neutrophils Relative %: 61.3 % (ref 43.0–77.0)
Platelets: 244 10*3/uL (ref 150.0–400.0)
RBC: 5.03 Mil/uL (ref 3.87–5.11)
RDW: 12.9 % (ref 11.5–15.5)
WBC: 5.3 10*3/uL (ref 4.0–10.5)

## 2023-05-28 LAB — HEMOGLOBIN A1C: Hgb A1c MFr Bld: 5.4 % (ref 4.6–6.5)

## 2023-05-28 LAB — BASIC METABOLIC PANEL
BUN: 9 mg/dL (ref 6–23)
CO2: 28 meq/L (ref 19–32)
Calcium: 9.3 mg/dL (ref 8.4–10.5)
Chloride: 106 meq/L (ref 96–112)
Creatinine, Ser: 0.77 mg/dL (ref 0.40–1.20)
GFR: 91.03 mL/min (ref >60.00)
Glucose, Bld: 87 mg/dL (ref 70–99)
Potassium: 4 meq/L (ref 3.5–5.1)
Sodium: 141 meq/L (ref 135–145)

## 2023-05-28 LAB — VITAMIN D 25 HYDROXY (VIT D DEFICIENCY, FRACTURES): VITD: 39.69 ng/mL (ref 30.00–100.00)

## 2023-05-28 LAB — TSH: TSH: 2.14 u[IU]/mL (ref 0.35–5.50)

## 2023-05-28 MED ORDER — BUPROPION HCL ER (XL) 150 MG PO TB24
150.0000 mg | ORAL_TABLET | Freq: Every day | ORAL | 3 refills | Status: AC
  Filled 2023-05-28: qty 90, 90d supply, fill #0

## 2023-05-28 MED ORDER — MONTELUKAST SODIUM 10 MG PO TABS
10.0000 mg | ORAL_TABLET | Freq: Every day | ORAL | 3 refills | Status: AC
  Filled 2023-05-28: qty 90, 90d supply, fill #0

## 2023-05-28 NOTE — Assessment & Plan Note (Signed)
 Ongoing issue.  Encouraged low carb diet and regular exercise.  Check labs to risk stratify.  Will follow.

## 2023-05-28 NOTE — Patient Instructions (Signed)
 Follow up in 1 year or as needed We'll notify you of your lab results and make any changes if needed Continue to work on healthy diet and regular exercise- you can do it! Think about colonoscopy vs cologuard and let me know Call with any questions or concerns Stay Safe!  Stay Healthy! Happy Spring!!

## 2023-05-28 NOTE — Assessment & Plan Note (Signed)
 Pt's PE WNL w/ exception of BMI.  UTD on mammo, pap, Tdap.  Due for colon cancer screening but is not sure what she wants to do- if anything.  Check labs.  Anticipatory guidance provided.

## 2023-05-28 NOTE — Progress Notes (Signed)
   Subjective:    Patient ID: Tina Bautista, female    DOB: [birth year], 48 y.o.   MRN: [id number]  HPI CPE- UTD on mammo, pap, Tdap.  Due for colonoscopy- pt is not willing at this time  Patient Care Team    Relationship Specialty Notifications Start End  Sheliah Hatch, MD PCP - General Family Medicine  01/23/16   Hyacinth Meeker, MD Referring Physician Obstetrics and Gynecology  10/20/19     Health Maintenance  Topic Date Due   Colonoscopy  Never done   INFLUENZA VACCINE  Never done   MAMMOGRAM  05/03/2024   DTaP/Tdap/Td (2 - Td or Tdap) 01/22/2025   Cervical Cancer Screening (HPV/Pap Cotest)  04/25/2027   Hepatitis C Screening  Completed   HIV Screening  Completed   HPV VACCINES  Aged Out   COVID-19 Vaccine  Discontinued      Review of Systems Patient reports no vision/ hearing changes, adenopathy,fever, persistant/recurrent hoarseness , swallowing issues, chest pain, palpitations, edema, persistant/recurrent cough, hemoptysis, dyspnea (rest/exertional/paroxysmal nocturnal), gastrointestinal bleeding (melena, rectal bleeding), abdominal pain, significant heartburn, bowel changes, GU symptoms (dysuria, hematuria, incontinence), Gyn symptoms (abnormal  bleeding, pain),  syncope, focal weakness, memory loss, numbness & tingling, skin/hair/nail changes, abnormal bruising or bleeding, anxiety, or depression.   + 6 lb weight gain    Objective:   Physical Exam General Appearance:    Alert, cooperative, no distress, appears stated age, obese  Head:    Normocephalic, without obvious abnormality, atraumatic  Eyes:    PERRL, conjunctiva/corneas clear, EOM's intact both eyes  Ears:    Normal TM's and external ear canals, both ears  Nose:   Nares normal, septum midline, mucosa normal, no drainage    or sinus tenderness  Throat:   Lips, mucosa, and tongue normal; teeth and gums normal  Neck:   Supple, symmetrical, trachea midline, no adenopathy;    Thyroid: no  enlargement/tenderness/nodules  Back:     Symmetric, no curvature, ROM normal, no CVA tenderness  Lungs:     Clear to auscultation bilaterally, respirations unlabored  Chest Wall:    No tenderness or deformity   Heart:    Regular rate and rhythm, S1 and S2 normal, no murmur, rub   or gallop  Breast Exam:    Deferred to GYN  Abdomen:     Soft, non-tender, bowel sounds active all four quadrants,    no masses, no organomegaly  Genitalia:    Deferred to GYN  Rectal:    Extremities:   Extremities normal, atraumatic, no cyanosis or edema  Pulses:   2+ and symmetric all extremities  Skin:   Skin color, texture, turgor normal, no rashes or lesions  Lymph nodes:   Cervical, supraclavicular, and axillary nodes normal  Neurologic:   CNII-XII intact, normal strength, sensation and reflexes    throughout          Assessment & Plan:

## 2023-05-29 ENCOUNTER — Encounter: Payer: Self-pay | Admitting: Family Medicine

## 2023-06-11 ENCOUNTER — Encounter: Payer: Self-pay | Admitting: Family Medicine

## 2023-06-11 ENCOUNTER — Ambulatory Visit: Admitting: Family Medicine

## 2023-06-11 ENCOUNTER — Other Ambulatory Visit (HOSPITAL_BASED_OUTPATIENT_CLINIC_OR_DEPARTMENT_OTHER): Payer: Self-pay

## 2023-06-11 VITALS — BP 136/74 | HR 93 | Temp 98.6°F | Ht 63.0 in | Wt 221.6 lb

## 2023-06-11 DIAGNOSIS — J309 Allergic rhinitis, unspecified: Secondary | ICD-10-CM | POA: Diagnosis not present

## 2023-06-11 DIAGNOSIS — J019 Acute sinusitis, unspecified: Secondary | ICD-10-CM | POA: Diagnosis not present

## 2023-06-11 DIAGNOSIS — R051 Acute cough: Secondary | ICD-10-CM

## 2023-06-11 MED ORDER — BENZONATATE 100 MG PO CAPS
100.0000 mg | ORAL_CAPSULE | Freq: Three times a day (TID) | ORAL | 0 refills | Status: AC | PRN
  Filled 2023-06-11: qty 20, 7d supply, fill #0

## 2023-06-11 MED ORDER — GUAIFENESIN-CODEINE 100-10 MG/5ML PO SOLN
5.0000 mL | Freq: Three times a day (TID) | ORAL | 0 refills | Status: AC | PRN
  Filled 2023-06-11: qty 120, 4d supply, fill #0

## 2023-06-11 MED ORDER — PREDNISONE 20 MG PO TABS
40.0000 mg | ORAL_TABLET | Freq: Every day | ORAL | 0 refills | Status: AC
  Filled 2023-06-11: qty 6, 3d supply, fill #0

## 2023-06-11 MED ORDER — AMOXICILLIN-POT CLAVULANATE 875-125 MG PO TABS
1.0000 | ORAL_TABLET | Freq: Two times a day (BID) | ORAL | 0 refills | Status: AC
Start: 2023-06-11 — End: ?
  Filled 2023-06-11: qty 20, 10d supply, fill #0

## 2023-06-11 NOTE — Progress Notes (Signed)
 Subjective:  Patient ID: Tina Bautista, female    DOB: 02-Sep-1975  Age: 48 y.o. MRN: [id number]  CC:  Chief Complaint  Patient presents with   Sinusitis    Pt notes 1.5 week ago started with cough, congestion sinus pressure, facial pressure, drainage, notes has taken some OTC meds which have not helped     HPI Tina Bautista presents for   Acute visit for above PCP, Dr. Beverely Low.  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  Claritin, singulair daily. Benadryl as needed. Some sneezing.  No current nasal sprays. Not effective.  Face pain, pnd, sneezing. Clear nasal d/c.  Cough with pnd. Slight wheezy at times, coughing fits during day.  No hx of asthma.   No fever.   History Patient Active Problem List   Diagnosis Date Noted   Anxiety 07/18/2022   Allergic rhinitis 05/27/2018   Physical exam 10/15/2017   Obesity (BMI 30-39.9) 10/15/2017   Vitamin D deficiency 10/15/2017   Past Medical History:  Diagnosis Date   Allergy    Past Surgical History:  Procedure Laterality Date   CESAREAN SECTION     3   No Known Allergies Prior to Admission medications   Medication Sig Start Date End Date Taking? Authorizing Provider  buPROPion (WELLBUTRIN XL) 150 MG 24 hr tablet Take 1 tablet (150 mg total) by mouth daily. 05/28/23  Yes Sheliah Hatch, MD  doxycycline (ADOXA) 50 MG tablet Take 1 tablet (50 mg total) by mouth 2 (two) times daily with food or water. 11/19/22  Yes Sheliah Hatch, MD  METRONIDAZOLE, TOPICAL, 0.75 % LOTN Apply to affected areas up to twice daily as needed 04/29/21  Yes   montelukast (SINGULAIR) 10 MG tablet Take 1 tablet (10 mg total) by mouth at bedtime. 05/28/23  Yes Sheliah Hatch, MD  Vitamin D, Ergocalciferol, (DRISDOL) 1.25 MG (50000 UNIT) CAPS capsule Take 1 capsule (50,000 Units total) by mouth every 7 (seven) days. 05/26/22  Yes Sheliah Hatch, MD   Social History   Socioeconomic History   Marital status: Married     Spouse name: Not on file   Number of children: Not on file   Years of education: Not on file   Highest education level: Not on file  Occupational History   Not on file  Tobacco Use   Smoking status: Never   Smokeless tobacco: Never  Vaping Use   Vaping status: Never Used  Substance and Sexual Activity   Alcohol use: Yes   Drug use: No   Sexual activity: Yes    Birth control/protection: None  Other Topics Concern   Not on file  Social History Narrative   Not on file   Social Drivers of Health   Financial Resource Strain: Not on file  Food Insecurity: No Food Insecurity (02/25/2021)   Received from [facility], [facility]   Hunger Vital Sign    Worried About Running Out of Food in the Last Year: Never true    Ran Out of Food in the Last Year: Never true  Transportation Needs: Not on file  Physical Activity: Not on file  Stress: Not on file  Social Connections: Unknown (08/13/2021)   Received from [facility], [facility]   Social Network    Social Network: Not on file  Intimate Partner Violence: Unknown (07/04/2021)   Received from [facility], [facility]   HITS    Physically Hurt: Not on file    Insult or  Talk Down To: Not on file    Threaten Physical Harm: Not on file    Scream or Curse: Not on file    Review of Systems   Objective:   Vitals:   06/11/23 0934  BP: 136/74  Pulse: 93  Temp: 98.6 F (37 C)  TempSrc: Temporal  SpO2: 96%  Weight: 221 lb 9.6 oz (100.5 kg)  Height: 5\' 3"  (1.6 m)     Physical Exam Vitals reviewed.  Constitutional:      General: She is not in acute distress.    Appearance: She is well-developed.  HENT:     Head: Normocephalic and atraumatic.     Right Ear: Hearing, tympanic membrane, ear canal and external ear normal.     Left Ear: Hearing, tympanic membrane, ear canal and external ear normal.     Nose: Congestion present.     Comments: Bilateral frontal and maxillary sinus tenderness with percussion.     Mouth/Throat:     Pharynx: No posterior oropharyngeal erythema.  Eyes:     Conjunctiva/sclera: Conjunctivae normal.     Pupils: Pupils are equal, round, and reactive to light.  Cardiovascular:     Rate and Rhythm: Normal rate and regular rhythm.     Heart sounds: Normal heart sounds. No murmur heard. Pulmonary:     Effort: Pulmonary effort is normal. No respiratory distress.     Breath sounds: Normal breath sounds. No wheezing or rhonchi.     Comments: Lungs clear, no stridor or wheeze. Skin:    General: Skin is warm and dry.     Findings: No rash.  Neurological:     Mental Status: She is alert and oriented to person, place, and time.  Psychiatric:        Mood and Affect: Mood normal.        Behavior: Behavior normal.        Assessment & Plan:  Tina Bautista is a 48 y.o. female . Acute sinusitis, recurrence not specified, unspecified location - Plan: amoxicillin-clavulanate (AUGMENTIN) 875-125 MG tablet, predniSONE (DELTASONE) 20 MG tablet  Allergic rhinitis, unspecified seasonality, unspecified trigger - Plan: predniSONE (DELTASONE) 20 MG tablet  Acute cough - Plan: benzonatate (TESSALON) 100 MG capsule, guaiFENesin-codeine 100-10 MG/5ML syrup  Approximately 6 to 7-day history of sinus congestion, likely allergic symptoms noted after exposure to dust from landscaping.  No discolored nasal discharge.  Less likely bacterial sinusitis as time, more likely allergic flare.  Short-term prednisone with potential side effects and risk discussed.  Continue antihistamine, option to switch to Allegra.  Did not tolerate Xyzal in the past.  Continue Singulair.  Restart steroid nasal spray with correct technique discussed.  Tessalon Perles if needed for cough or codeine cough syrup if needed with potential side effects discussed.  Augmentin prescribed if not improving in the next few days or discolored nasal discharge with side effects discussed.  RTC precautions given.  Meds ordered this  encounter  Medications   amoxicillin-clavulanate (AUGMENTIN) 875-125 MG tablet    Sig: Take 1 tablet by mouth 2 (two) times daily.    Dispense:  20 tablet    Refill:  0   benzonatate (TESSALON) 100 MG capsule    Sig: Take 1 capsule (100 mg total) by mouth 3 (three) times daily as needed for cough.    Dispense:  20 capsule    Refill:  0   guaiFENesin-codeine 100-10 MG/5ML syrup    Sig: Take 5-10 mLs by mouth 3 (three) times daily  as needed for cough.    Dispense:  120 mL    Refill:  0   predniSONE (DELTASONE) 20 MG tablet    Sig: Take 2 tablets (40 mg total) by mouth daily with breakfast.    Dispense:  6 tablet    Refill:  0   Patient Instructions  Sorry to hear that you still having sinus issues and cough.  It does sound like the start of this was likely from allergies and may still be from allergies, less likely bacterial infection.  I do recommend trying to restart a steroid nasal spray such as Flonase, Nasacort or Nasonex.  Try using the technique we discussed with nose to the toes, near to the ear.  I sent in some prednisone that should help with the inflammation and allergies temporarily, continue Singulair, and Claritin or Allegra once per day.  Tessalon Perles if needed for cough, codeine cough syrup if needed for more cough suppression or for Bautista.  If sinus pain/pressure is not improving in the next day or 2 or discolored nasal discharge then I did print antibiotic for Augmentin.  I think it is reasonable to wait a few days to start that medicine if you are not improving.  Let us know if there are questions and hope you feel better soon.  Sinus Pain  Sinus pain may occur when your sinuses become clogged or swollen. Sinuses are air-filled spaces in your skull that are behind the bones of your face and forehead. Sinus pain can range from mild to severe. What are the causes? Sinus pain can result from various conditions that affect the sinuses. Common causes include: Colds. Sinus  infections. Allergies. What are the signs or symptoms? The main symptom of this condition is pain or pressure in your face, forehead, ears, or upper teeth. People who have sinus pain often have other symptoms, such as: Congested or runny nose. Fever. Inability to smell. Headache. Weather changes can make symptoms worse. How is this diagnosed? Your health care provider will diagnose this condition based on your symptoms and a physical exam. If you have pain that keeps coming back or does not go away, your health care provider may recommend more testing. This may include: Imaging tests, such as a CT scan or MRI, to check for problems with your sinuses. Examination of your sinuses using a thin tool with a camera that is inserted through your nose (endoscopy). How is this treated? Treatment for this condition depends on the cause. Sinus pain that is caused by a sinus infection may be treated with antibiotic medicine. Sinus pain that is caused by congestion may be helped by rinsing out (flushing) the nose and sinuses with saline solution. Sinus pain that is caused by allergies may be helped by allergy medicines (antihistamines) and medicated nasal sprays. Sinus surgery may be needed in some cases if other treatments do not help. Follow these instructions at home: General instructions If directed: Apply a warm, moist washcloth to your face to help relieve pain. Use a nasal saline wash. Follow the directions on the bottle or box. Hydrate and humidify Drink enough water to keep your urine clear or pale yellow. Staying hydrated will help to thin your mucus. Use a humidifier if your home is dry. Inhale steam for 10-15 minutes, 3-4 times a day or as told by your health care provider. You can do this in the bathroom while a hot shower is running. Limit your exposure to cool or dry air. Medicines  Take over-the-counter and prescription medicines only as told by your health care provider. If you  were prescribed an antibiotic medicine, take it as told by your health care provider. Do not stop taking the antibiotic even if you start to feel better. If you have congestion, use a nasal spray to help lessen pressure. Contact a health care provider if: You have sinus pain more than one time a week. You have sensitivity to light or sound. You develop a fever. You feel nauseous or you vomit. Your sinus pain or headache does not get better with treatment. Get help right away if: You have vision problems. You have sudden, severe pain in your face or head. You have a seizure. You are confused. You have a stiff neck. Summary Sinus pain occurs when your sinuses become clogged or swollen. Sinus pain can result from various conditions that affect the sinuses, such as a cold, a sinus infection, or an allergy. Treatment for this condition depends on the cause. It may include medicine, such as antibiotics or antihistamines. This information is not intended to replace advice given to you by your health care provider. Make sure you discuss any questions you have with your health care provider. Document Revised: 02/17/2021 Document Reviewed: 02/17/2021 Elsevier Patient Education  2024 [employer].    Signed,   Meredith Staggers, MD [facility], [facility] 06/11/23 10:38 AM

## 2023-06-11 NOTE — Patient Instructions (Signed)
 Sorry to hear that you still having sinus issues and cough.  It does sound like the start of this was likely from allergies and may still be from allergies, less likely bacterial infection.  I do recommend trying to restart a steroid nasal spray such as Flonase, Nasacort or Nasonex.  Try using the technique we discussed with nose to the toes, near to the ear.  I sent in some prednisone that should help with the inflammation and allergies temporarily, continue Singulair, and Claritin or Allegra once per day.  Tessalon Perles if needed for cough, codeine cough syrup if needed for more cough suppression or for sleep.  If sinus pain/pressure is not improving in the next day or 2 or discolored nasal discharge then I did print antibiotic for Augmentin.  I think it is reasonable to wait a few days to start that medicine if you are not improving.  Let us know if there are questions and hope you feel better soon.  Sinus Pain  Sinus pain may occur when your sinuses become clogged or swollen. Sinuses are air-filled spaces in your skull that are behind the bones of your face and forehead. Sinus pain can range from mild to severe. What are the causes? Sinus pain can result from various conditions that affect the sinuses. Common causes include: Colds. Sinus infections. Allergies. What are the signs or symptoms? The main symptom of this condition is pain or pressure in your face, forehead, ears, or upper teeth. People who have sinus pain often have other symptoms, such as: Congested or runny nose. Fever. Inability to smell. Headache. Weather changes can make symptoms worse. How is this diagnosed? Your health care provider will diagnose this condition based on your symptoms and a physical exam. If you have pain that keeps coming back or does not go away, your health care provider may recommend more testing. This may include: Imaging tests, such as a CT scan or MRI, to check for problems with your  sinuses. Examination of your sinuses using a thin tool with a camera that is inserted through your nose (endoscopy). How is this treated? Treatment for this condition depends on the cause. Sinus pain that is caused by a sinus infection may be treated with antibiotic medicine. Sinus pain that is caused by congestion may be helped by rinsing out (flushing) the nose and sinuses with saline solution. Sinus pain that is caused by allergies may be helped by allergy medicines (antihistamines) and medicated nasal sprays. Sinus surgery may be needed in some cases if other treatments do not help. Follow these instructions at home: General instructions If directed: Apply a warm, moist washcloth to your face to help relieve pain. Use a nasal saline wash. Follow the directions on the bottle or box. Hydrate and humidify Drink enough water to keep your urine clear or pale yellow. Staying hydrated will help to thin your mucus. Use a humidifier if your home is dry. Inhale steam for 10-15 minutes, 3-4 times a day or as told by your health care provider. You can do this in the bathroom while a hot shower is running. Limit your exposure to cool or dry air. Medicines  Take over-the-counter and prescription medicines only as told by your health care provider. If you were prescribed an antibiotic medicine, take it as told by your health care provider. Do not stop taking the antibiotic even if you start to feel better. If you have congestion, use a nasal spray to help lessen pressure. Contact a health  care provider if: You have sinus pain more than one time a week. You have sensitivity to light or sound. You develop a fever. You feel nauseous or you vomit. Your sinus pain or headache does not get better with treatment. Get help right away if: You have vision problems. You have sudden, severe pain in your face or head. You have a seizure. You are confused. You have a stiff neck. Summary Sinus pain occurs  when your sinuses become clogged or swollen. Sinus pain can result from various conditions that affect the sinuses, such as a cold, a sinus infection, or an allergy. Treatment for this condition depends on the cause. It may include medicine, such as antibiotics or antihistamines. This information is not intended to replace advice given to you by your health care provider. Make sure you discuss any questions you have with your health care provider. Document Revised: 02/17/2021 Document Reviewed: 02/17/2021 Elsevier Patient Education  2024 [employer].

## 2024-03-10 DIAGNOSIS — F411 Generalized anxiety disorder: Secondary | ICD-10-CM | POA: Diagnosis not present

## 2024-03-21 DIAGNOSIS — F411 Generalized anxiety disorder: Secondary | ICD-10-CM | POA: Diagnosis not present

## 2024-04-12 ENCOUNTER — Other Ambulatory Visit (HOSPITAL_BASED_OUTPATIENT_CLINIC_OR_DEPARTMENT_OTHER): Payer: Self-pay | Admitting: Obstetrics and Gynecology

## 2024-04-12 DIAGNOSIS — Z1231 Encounter for screening mammogram for malignant neoplasm of breast: Secondary | ICD-10-CM

## 2024-05-16 ENCOUNTER — Ambulatory Visit (HOSPITAL_BASED_OUTPATIENT_CLINIC_OR_DEPARTMENT_OTHER)

## 2024-05-17 ENCOUNTER — Ambulatory Visit (HOSPITAL_BASED_OUTPATIENT_CLINIC_OR_DEPARTMENT_OTHER)

## 2024-05-31 ENCOUNTER — Encounter: Payer: 59 | Admitting: Family Medicine
